# Patient Record
Sex: Female | Born: 1990 | Race: White | Hispanic: No | Smoking: Never smoker
Health system: Southern US, Community
[De-identification: ages and names within clinical notes are randomized; demographics above are authoritative.]

## PROBLEM LIST (undated history)

## (undated) ENCOUNTER — Inpatient Hospital Stay (HOSPITAL_COMMUNITY): Payer: Self-pay

## (undated) DIAGNOSIS — N200 Calculus of kidney: Secondary | ICD-10-CM

## (undated) DIAGNOSIS — J189 Pneumonia, unspecified organism: Secondary | ICD-10-CM

## (undated) DIAGNOSIS — J111 Influenza due to unidentified influenza virus with other respiratory manifestations: Secondary | ICD-10-CM

## (undated) DIAGNOSIS — N189 Chronic kidney disease, unspecified: Secondary | ICD-10-CM

## (undated) HISTORY — PX: TONSILLECTOMY: SUR1361

## (undated) HISTORY — PX: LAPAROSCOPIC ABDOMINAL EXPLORATION: SHX6249

## (undated) HISTORY — DX: Chronic kidney disease, unspecified: N18.9

---
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
## 2009-01-29 ENCOUNTER — Observation Stay: Payer: Self-pay

## 2009-01-29 ENCOUNTER — Inpatient Hospital Stay: Payer: Self-pay

## 2009-07-02 ENCOUNTER — Ambulatory Visit: Payer: Self-pay | Admitting: Internal Medicine

## 2009-09-29 ENCOUNTER — Emergency Department: Payer: Self-pay | Admitting: Emergency Medicine

## 2010-06-02 ENCOUNTER — Emergency Department: Payer: Self-pay | Admitting: Internal Medicine

## 2010-09-04 ENCOUNTER — Emergency Department: Payer: Self-pay | Admitting: Emergency Medicine

## 2011-03-13 ENCOUNTER — Emergency Department: Payer: Self-pay | Admitting: Emergency Medicine

## 2011-03-18 ENCOUNTER — Emergency Department: Payer: Self-pay | Admitting: Emergency Medicine

## 2011-07-16 ENCOUNTER — Emergency Department: Payer: Self-pay | Admitting: Emergency Medicine

## 2011-07-16 LAB — URINALYSIS, COMPLETE
Bilirubin,UR: NEGATIVE
Blood: NEGATIVE
Glucose,UR: NEGATIVE mg/dL (ref 0–75)
Ketone: NEGATIVE
Leukocyte Esterase: NEGATIVE
Ph: 8 (ref 4.5–8.0)
RBC,UR: 2 /HPF (ref 0–5)
Squamous Epithelial: 14
WBC UR: 5 /HPF (ref 0–5)

## 2011-07-16 LAB — PREGNANCY, URINE: Pregnancy Test, Urine: NEGATIVE m[IU]/mL

## 2012-02-15 ENCOUNTER — Emergency Department: Payer: Self-pay | Admitting: Emergency Medicine

## 2012-02-15 LAB — URINALYSIS, COMPLETE
Bacteria: NONE SEEN
Blood: NEGATIVE
Glucose,UR: NEGATIVE mg/dL (ref 0–75)
Ketone: NEGATIVE
Leukocyte Esterase: NEGATIVE
Nitrite: NEGATIVE
Ph: 6 (ref 4.5–8.0)
Protein: 25
Specific Gravity: 1.02 (ref 1.003–1.030)
Squamous Epithelial: 4
WBC UR: 3 /HPF (ref 0–5)

## 2012-09-09 ENCOUNTER — Emergency Department: Payer: Self-pay | Admitting: Emergency Medicine

## 2013-01-06 ENCOUNTER — Encounter (HOSPITAL_COMMUNITY): Payer: Self-pay | Admitting: Radiology

## 2013-01-06 ENCOUNTER — Emergency Department (HOSPITAL_COMMUNITY): Payer: No Typology Code available for payment source

## 2013-01-06 ENCOUNTER — Emergency Department (HOSPITAL_COMMUNITY)
Admission: EM | Admit: 2013-01-06 | Discharge: 2013-01-06 | Disposition: A | Discharge status: Home / Self Care | Payer: No Typology Code available for payment source | Attending: Emergency Medicine | Admitting: Emergency Medicine

## 2013-01-06 DIAGNOSIS — S139XXA Sprain of joints and ligaments of unspecified parts of neck, initial encounter: Secondary | ICD-10-CM | POA: Insufficient documentation

## 2013-01-06 DIAGNOSIS — S161XXA Strain of muscle, fascia and tendon at neck level, initial encounter: Secondary | ICD-10-CM

## 2013-01-06 DIAGNOSIS — S233XXA Sprain of ligaments of thoracic spine, initial encounter: Secondary | ICD-10-CM

## 2013-01-06 DIAGNOSIS — Y92410 Unspecified street and highway as the place of occurrence of the external cause: Secondary | ICD-10-CM | POA: Insufficient documentation

## 2013-01-06 DIAGNOSIS — S239XXA Sprain of unspecified parts of thorax, initial encounter: Secondary | ICD-10-CM | POA: Insufficient documentation

## 2013-01-06 DIAGNOSIS — S0083XA Contusion of other part of head, initial encounter: Secondary | ICD-10-CM

## 2013-01-06 DIAGNOSIS — Z3202 Encounter for pregnancy test, result negative: Secondary | ICD-10-CM | POA: Insufficient documentation

## 2013-01-06 DIAGNOSIS — S39012A Strain of muscle, fascia and tendon of lower back, initial encounter: Secondary | ICD-10-CM

## 2013-01-06 DIAGNOSIS — Y9389 Activity, other specified: Secondary | ICD-10-CM | POA: Insufficient documentation

## 2013-01-06 DIAGNOSIS — S335XXA Sprain of ligaments of lumbar spine, initial encounter: Secondary | ICD-10-CM | POA: Insufficient documentation

## 2013-01-06 DIAGNOSIS — S0003XA Contusion of scalp, initial encounter: Secondary | ICD-10-CM | POA: Insufficient documentation

## 2013-01-06 LAB — URINE MICROSCOPIC-ADD ON

## 2013-01-06 LAB — CBC WITH DIFFERENTIAL/PLATELET
Basophils Absolute: 0.1 10*3/uL (ref 0.0–0.1)
Basophils Relative: 1 % (ref 0–1)
Eosinophils Absolute: 0.2 10*3/uL (ref 0.0–0.7)
Eosinophils Relative: 3 % (ref 0–5)
HCT: 43.1 % (ref 36.0–46.0)
MCHC: 35 g/dL (ref 30.0–36.0)
MCV: 92.9 fL (ref 78.0–100.0)
Monocytes Absolute: 0.4 10*3/uL (ref 0.1–1.0)
Platelets: 200 10*3/uL (ref 150–400)
RDW: 12 % (ref 11.5–15.5)
WBC: 5.9 10*3/uL (ref 4.0–10.5)

## 2013-01-06 LAB — URINALYSIS, ROUTINE W REFLEX MICROSCOPIC
Glucose, UA: NEGATIVE mg/dL
Leukocytes, UA: NEGATIVE
Protein, ur: NEGATIVE mg/dL
Specific Gravity, Urine: 1.02 (ref 1.005–1.030)

## 2013-01-06 LAB — CG4 I-STAT (LACTIC ACID): Lactic Acid, Venous: 1.61 mmol/L (ref 0.5–2.2)

## 2013-01-06 LAB — COMPREHENSIVE METABOLIC PANEL
ALT: 15 U/L (ref 0–35)
AST: 19 U/L (ref 0–37)
Albumin: 3.7 g/dL (ref 3.5–5.2)
CO2: 19 mEq/L (ref 19–32)
Calcium: 8.7 mg/dL (ref 8.4–10.5)
Creatinine, Ser: 0.71 mg/dL (ref 0.50–1.10)
GFR calc non Af Amer: >90 mL/min (ref >90)
Sodium: 137 mEq/L (ref 135–145)
Total Protein: 7.1 g/dL (ref 6.0–8.3)

## 2013-01-06 MED ORDER — SODIUM CHLORIDE 0.9 % IV BOLUS (SEPSIS): 1000.0000 mL | Freq: Once | INTRAVENOUS | Status: DC

## 2013-01-06 MED ORDER — MORPHINE SULFATE 4 MG/ML IJ SOLN
4.0000 mg | Freq: Once | INTRAMUSCULAR | Status: AC
  Administered 2013-01-06: 4 mg via INTRAVENOUS
  Filled 2013-01-06: qty 1

## 2013-01-06 MED ORDER — ONDANSETRON HCL 4 MG/2ML IJ SOLN
4.0000 mg | Freq: Once | INTRAMUSCULAR | Status: AC
  Administered 2013-01-06: 4 mg via INTRAVENOUS
  Filled 2013-01-06: qty 2

## 2013-01-06 MED ORDER — IOHEXOL 300 MG/ML  SOLN
100.0000 mL | Freq: Once | INTRAMUSCULAR | Status: AC | PRN
  Administered 2013-01-06: 100 mL via INTRAVENOUS

## 2013-01-06 MED ORDER — SODIUM CHLORIDE 0.9 % IV SOLN
1000.0000 mL | INTRAVENOUS | Status: DC
  Administered 2013-01-06: 1000 mL via INTRAVENOUS

## 2013-01-06 MED ORDER — OXYCODONE-ACETAMINOPHEN 5-325 MG PO TABS: 1.0000 | ORAL_TABLET | ORAL | Status: DC | PRN

## 2013-01-06 MED ORDER — OXYCODONE-ACETAMINOPHEN 5-325 MG PO TABS
1.0000 | ORAL_TABLET | Freq: Once | ORAL | Status: AC
  Administered 2013-01-06: 1 via ORAL
  Filled 2013-01-06: qty 1

## 2013-01-06 MED ORDER — SODIUM CHLORIDE 0.9 % IV SOLN
1000.0000 mL | Freq: Once | INTRAVENOUS | Status: AC
  Administered 2013-01-06: 1000 mL via INTRAVENOUS
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
## 2013-01-06 NOTE — ED Notes (Signed)
MD at bedside. 
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
## 2013-01-06 NOTE — ED Notes (Signed)
Dr. Glick at bedside.  

## 2013-01-09 ENCOUNTER — Emergency Department (HOSPITAL_COMMUNITY): Payer: No Typology Code available for payment source

## 2013-01-09 ENCOUNTER — Emergency Department (HOSPITAL_COMMUNITY)
Admission: EM | Admit: 2013-01-09 | Discharge: 2013-01-09 | Disposition: A | Discharge status: Home / Self Care | Payer: Medicaid Other | Attending: Emergency Medicine | Admitting: Emergency Medicine

## 2013-01-09 ENCOUNTER — Encounter (HOSPITAL_COMMUNITY): Payer: Self-pay | Admitting: Emergency Medicine

## 2013-01-09 DIAGNOSIS — R112 Nausea with vomiting, unspecified: Secondary | ICD-10-CM | POA: Insufficient documentation

## 2013-01-09 DIAGNOSIS — R51 Headache: Secondary | ICD-10-CM | POA: Insufficient documentation

## 2013-01-09 DIAGNOSIS — F0781 Postconcussional syndrome: Secondary | ICD-10-CM

## 2013-01-09 DIAGNOSIS — R42 Dizziness and giddiness: Secondary | ICD-10-CM | POA: Insufficient documentation

## 2013-01-09 DIAGNOSIS — Z79899 Other long term (current) drug therapy: Secondary | ICD-10-CM | POA: Insufficient documentation

## 2013-01-09 DIAGNOSIS — M542 Cervicalgia: Secondary | ICD-10-CM | POA: Insufficient documentation

## 2013-01-09 MED ORDER — ONDANSETRON HCL 4 MG/2ML IJ SOLN
4.0000 mg | Freq: Once | INTRAMUSCULAR | Status: AC
  Administered 2013-01-09: 4 mg via INTRAVENOUS
  Filled 2013-01-09: qty 2

## 2013-01-09 MED ORDER — SODIUM CHLORIDE 0.9 % IV SOLN
INTRAVENOUS | Status: DC
  Administered 2013-01-09: 19:00:00 via INTRAVENOUS

## 2013-01-09 MED ORDER — OXYCODONE-ACETAMINOPHEN 5-325 MG PO TABS: 1.0000 | ORAL_TABLET | ORAL | Status: DC | PRN

## 2013-01-09 MED ORDER — MORPHINE SULFATE 4 MG/ML IJ SOLN
4.0000 mg | Freq: Once | INTRAMUSCULAR | Status: AC
  Administered 2013-01-09: 4 mg via INTRAVENOUS
  Filled 2013-01-09: qty 1

## 2013-01-09 MED ORDER — SODIUM CHLORIDE 0.9 % IV BOLUS (SEPSIS)
1000.0000 mL | Freq: Once | INTRAVENOUS | Status: AC
  Administered 2013-01-09: 1000 mL via INTRAVENOUS

## 2013-01-09 MED ORDER — METHOCARBAMOL 500 MG PO TABS: 500.0000 mg | ORAL_TABLET | Freq: Two times a day (BID) | ORAL | Status: DC
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
## 2013-01-09 NOTE — ED Notes (Signed)
Pt undressed waiting in room
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
## 2013-01-09 NOTE — ED Provider Notes (Signed)
Medical screening examination/treatment/procedure(s) were conducted as a shared visit with non-physician practitioner(s) and myself.  I personally evaluated the patient during the encounter  Please see my separate respective documentation pertaining to this patient encounter   Vida Roller, MD 01/09/13 1945
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
## 2013-01-09 NOTE — ED Notes (Signed)
PA at bedside.

## 2013-04-27 ENCOUNTER — Emergency Department (HOSPITAL_BASED_OUTPATIENT_CLINIC_OR_DEPARTMENT_OTHER)
Admission: EM | Admit: 2013-04-27 | Discharge: 2013-04-27 | Disposition: A | Discharge status: Home / Self Care | Payer: Medicaid Other | Attending: Emergency Medicine | Admitting: Emergency Medicine

## 2013-04-27 ENCOUNTER — Encounter (HOSPITAL_BASED_OUTPATIENT_CLINIC_OR_DEPARTMENT_OTHER): Payer: Self-pay | Admitting: Emergency Medicine

## 2013-04-27 ENCOUNTER — Emergency Department (HOSPITAL_BASED_OUTPATIENT_CLINIC_OR_DEPARTMENT_OTHER): Payer: Medicaid Other

## 2013-04-27 DIAGNOSIS — O4690 Antepartum hemorrhage, unspecified, unspecified trimester: Secondary | ICD-10-CM | POA: Insufficient documentation

## 2013-04-27 DIAGNOSIS — N949 Unspecified condition associated with female genital organs and menstrual cycle: Secondary | ICD-10-CM | POA: Insufficient documentation

## 2013-04-27 DIAGNOSIS — Z79899 Other long term (current) drug therapy: Secondary | ICD-10-CM | POA: Insufficient documentation

## 2013-04-27 DIAGNOSIS — R102 Pelvic and perineal pain: Secondary | ICD-10-CM

## 2013-04-27 DIAGNOSIS — M545 Low back pain, unspecified: Secondary | ICD-10-CM | POA: Insufficient documentation

## 2013-04-27 DIAGNOSIS — Z3490 Encounter for supervision of normal pregnancy, unspecified, unspecified trimester: Secondary | ICD-10-CM

## 2013-04-27 LAB — URINALYSIS, ROUTINE W REFLEX MICROSCOPIC
Bilirubin Urine: NEGATIVE
Hgb urine dipstick: NEGATIVE
Nitrite: NEGATIVE
Protein, ur: NEGATIVE mg/dL
Urobilinogen, UA: 0.2 mg/dL (ref 0.0–1.0)

## 2013-04-27 MED ORDER — HYDROCODONE-ACETAMINOPHEN 5-325 MG PO TABS: 2.0000 | ORAL_TABLET | ORAL | Status: DC | PRN

## 2013-04-27 MED ORDER — HYDROCODONE-ACETAMINOPHEN 5-325 MG PO TABS
2.0000 | ORAL_TABLET | Freq: Once | ORAL | Status: AC
  Administered 2013-04-27: 2 via ORAL
  Filled 2013-04-27: qty 2

## 2013-04-27 NOTE — ED Provider Notes (Signed)
CSN: [id number]     Arrival date & time 04/27/13  1759 History  This chart was scribed for Geoffery Lyons, MD by Leone Payor, ED Scribe. This patient was seen in room MH03/MH03 and the patient's care was started 6:17 PM.     Chief Complaint  Patient presents with  . Vaginal Bleeding    19 wk preg    The history is provided by the patient. No language interpreter was used.    HPI Comments: Katherine Conrad is a [redacted] week pregnant 22 y.o. female who presents to the Emergency Department complaining of constant, gradually worsening, lower abdominal and lower back pain that began 3 days ago. Pt describes this pain as cramping and states it is severe. Pt had an [place] at [facility] on 03/07/13 and was [redacted] weeks pregnant at that time. Pt reports having minor spotting before during this pregnancy. She has reports having 1 other full term pregnancy. She denies fever, dysuria, diarrhea, constipation.   History reviewed. No pertinent past medical history. Past Surgical History  Procedure Laterality Date  . Tonsillectomy    . Laparoscopic abdominal exploration     No family history on file. History  Substance Use Topics  . Smoking status: Never Smoker   . Smokeless tobacco: Not on file  . Alcohol Use: Yes   OB History   Grav Para Term Preterm Abortions TAB SAB Ect Mult Living   3 1 1             Review of Systems A complete 10 system review of systems was obtained and all systems are negative except as noted in the HPI and PMH.   Allergies  Review of patient's allergies indicates no known allergies.  Home Medications   Current Outpatient Rx  Name  Route  Sig  Dispense  Refill  . Prenatal Vit-Fe Fumarate-FA (PRENATAL MULTIVITAMIN) TABS tablet   Oral   Take 1 tablet by mouth daily at 12 noon.          BP 132/75  Pulse 100  Temp(Src) 97.9 F (36.6 C) (Oral)  Resp 18  Ht 5\' 7"  (1.702 m)  Wt 140 lb (63.504 kg)  BMI 21.92 kg/m2  SpO2 100%  LMP 01/03/2013 Physical Exam  Nursing  note and vitals reviewed. Constitutional: She is oriented to person, place, and time. She appears well-developed and well-nourished.  HENT:  Head: Normocephalic and atraumatic.  Cardiovascular: Normal rate, regular rhythm and normal heart sounds.   Pulmonary/Chest: Effort normal and breath sounds normal. No respiratory distress. She has no wheezes.  Abdominal: Soft. She exhibits no distension. There is tenderness.  Mild suprapubic tenderness to palpation. Fundal height is consistent with stated gestational age.   Neurological: She is alert and oriented to person, place, and time.  Skin: Skin is warm and dry.  Psychiatric: She has a normal mood and affect.    ED Course  Procedures   DIAGNOSTIC STUDIES: Oxygen Saturation is 100% on RA, normal by my interpretation.    COORDINATION OF CARE: 6:21 PM Discussed treatment plan with pt at bedside and pt agreed to plan.   Labs Review Labs Reviewed - No data to display Imaging Review No results found.    MDM  No diagnosis found. Patient is a 22 year old female G2 P1 001 at [redacted] weeks gestation. She presents here with lower abdominal discomfort for the past 3 days in the absence of any injury or trauma. She states that when she wiped after urinating she noticed a little  pinkish smear. She denies any fevers or chills. She denies any dysuria.  On exam she is tender in the suprapubic region with no rebound or guarding. Urinalysis reveals trace leukocytes and few bacteria but is otherwise unremarkable. An ultrasound was obtained which revealed a possible low-lying placenta however no acute findings. Fetal heart tones are present in the exam is otherwise reassuring. Patient tells me that she had similar discomfort with her first pregnancy.  She will be discharged to home with pain medication and advised to followup with OB in the next one to 2 weeks, sooner if not improving. She also understands to present to [facility] hospital if her symptoms worsen  or change.  I personally performed the services described in this documentation, which was scribed in my presence. The recorded information has been reviewed and is accurate.      Geoffery Lyons, MD 04/27/13 [phone]

## 2013-04-27 NOTE — ED Notes (Signed)
Pt reports she is 19 weeks preg- c/o abd pain x 3 days- states she noticed vaginal bleeding when she wiped after using the bathroom approx pta

## 2013-05-15 ENCOUNTER — Emergency Department (HOSPITAL_BASED_OUTPATIENT_CLINIC_OR_DEPARTMENT_OTHER)
Admission: EM | Admit: 2013-05-15 | Discharge: 2013-05-15 | Disposition: A | Discharge status: Home / Self Care | Payer: Medicaid Other | Attending: Emergency Medicine | Admitting: Emergency Medicine

## 2013-05-15 ENCOUNTER — Emergency Department (HOSPITAL_BASED_OUTPATIENT_CLINIC_OR_DEPARTMENT_OTHER): Payer: Medicaid Other

## 2013-05-15 ENCOUNTER — Encounter (HOSPITAL_BASED_OUTPATIENT_CLINIC_OR_DEPARTMENT_OTHER): Payer: Self-pay | Admitting: Emergency Medicine

## 2013-05-15 DIAGNOSIS — Z8701 Personal history of pneumonia (recurrent): Secondary | ICD-10-CM | POA: Insufficient documentation

## 2013-05-15 DIAGNOSIS — O9989 Other specified diseases and conditions complicating pregnancy, childbirth and the puerperium: Secondary | ICD-10-CM | POA: Insufficient documentation

## 2013-05-15 DIAGNOSIS — J111 Influenza due to unidentified influenza virus with other respiratory manifestations: Secondary | ICD-10-CM | POA: Insufficient documentation

## 2013-05-15 DIAGNOSIS — R69 Illness, unspecified: Secondary | ICD-10-CM

## 2013-05-15 HISTORY — DX: Pneumonia, unspecified organism: J18.9

## 2013-05-15 LAB — CBC WITH DIFFERENTIAL/PLATELET
BASOS ABS: 0 10*3/uL (ref 0.0–0.1)
BASOS PCT: 0 % (ref 0–1)
Eosinophils Absolute: 0.2 10*3/uL (ref 0.0–0.7)
Eosinophils Relative: 2 % (ref 0–5)
HCT: 35.1 % — ABNORMAL LOW (ref 36.0–46.0)
HEMOGLOBIN: 12 g/dL (ref 12.0–15.0)
Lymphocytes Relative: 29 % (ref 12–46)
Lymphs Abs: 2.9 10*3/uL (ref 0.7–4.0)
MCH: 31.5 pg (ref 26.0–34.0)
MCHC: 34.2 g/dL (ref 30.0–36.0)
MCV: 92.1 fL (ref 78.0–100.0)
Monocytes Absolute: 0.8 10*3/uL (ref 0.1–1.0)
Monocytes Relative: 8 % (ref 3–12)
NEUTROS ABS: 6.1 10*3/uL (ref 1.7–7.7)
NEUTROS PCT: 61 % (ref 43–77)
PLATELETS: 169 10*3/uL (ref 150–400)
RBC: 3.81 MIL/uL — ABNORMAL LOW (ref 3.87–5.11)
RDW: 11.7 % (ref 11.5–15.5)
WBC: 10 10*3/uL (ref 4.0–10.5)

## 2013-05-15 LAB — BASIC METABOLIC PANEL
BUN: 10 mg/dL (ref 6–23)
CHLORIDE: 102 meq/L (ref 96–112)
CO2: 22 mEq/L (ref 19–32)
Calcium: 9.2 mg/dL (ref 8.4–10.5)
Creatinine, Ser: 0.6 mg/dL (ref 0.50–1.10)
Glucose, Bld: 96 mg/dL (ref 70–99)
POTASSIUM: 3.6 meq/L — AB (ref 3.7–5.3)
SODIUM: 139 meq/L (ref 137–147)

## 2013-05-15 MED ORDER — HYDROCODONE-ACETAMINOPHEN 5-325 MG PO TABS: 2.0000 | ORAL_TABLET | ORAL | Status: DC | PRN

## 2013-05-15 MED ORDER — OSELTAMIVIR PHOSPHATE 75 MG PO CAPS: 75.0000 mg | ORAL_CAPSULE | Freq: Two times a day (BID) | ORAL | Status: DC

## 2013-05-15 MED ORDER — ONDANSETRON HCL 4 MG/2ML IJ SOLN
4.0000 mg | Freq: Once | INTRAMUSCULAR | Status: AC
  Administered 2013-05-15: 4 mg via INTRAVENOUS
  Filled 2013-05-15: qty 2

## 2013-05-15 MED ORDER — MORPHINE SULFATE 4 MG/ML IJ SOLN
4.0000 mg | Freq: Once | INTRAMUSCULAR | Status: AC
  Administered 2013-05-15: 4 mg via INTRAVENOUS
  Filled 2013-05-15: qty 1

## 2013-05-15 MED ORDER — ONDANSETRON 4 MG PO TBDP: 4.0000 mg | ORAL_TABLET | Freq: Three times a day (TID) | ORAL | Status: DC | PRN

## 2013-05-15 MED ORDER — SODIUM CHLORIDE 0.9 % IV BOLUS (SEPSIS)
1000.0000 mL | Freq: Once | INTRAVENOUS | Status: AC
  Administered 2013-05-15: 1000 mL via INTRAVENOUS

## 2013-05-15 NOTE — ED Provider Notes (Signed)
CSN: [id number]     Arrival date & time 05/15/13  1738 History   First MD Initiated Contact with Patient 05/15/13 1906     Chief Complaint  Patient presents with  . Fever   (Consider location/radiation/quality/duration/timing/severity/associated sxs/prior Treatment) HPI Comments: Patient is a 23 year old 353P1 female who is about 4 months pregnant who presents with multiple complaints since last night. Symptoms started gradually and progressively worsened since the onset. Patient reports generalized body aches, abdominal pain, nausea, vomiting, diarrhea, fever and cough. She has not tried anything for symptoms at home. She reports being around her brother who was recently diagnosed with the flu. No aggravating/alleviating factors.    Past Medical History  Diagnosis Date  . Pneumonia    Past Surgical History  Procedure Laterality Date  . Tonsillectomy    . Laparoscopic abdominal exploration     History reviewed. No pertinent family history. History  Substance Use Topics  . Smoking status: Never Smoker   . Smokeless tobacco: Not on file  . Alcohol Use: Yes   OB History   Grav Para Term Preterm Abortions TAB SAB Ect Mult Living   3 1 1             Review of Systems  Constitutional: Negative for fever, chills and fatigue.  HENT: Negative for trouble swallowing.   Eyes: Negative for visual disturbance.  Respiratory: Positive for cough. Negative for shortness of breath.   Cardiovascular: Negative for chest pain and palpitations.  Gastrointestinal: Positive for nausea, vomiting, abdominal pain and diarrhea.  Genitourinary: Negative for dysuria and difficulty urinating.  Musculoskeletal: Negative for arthralgias and neck pain.  Skin: Negative for color change.  Neurological: Negative for dizziness and weakness.  Psychiatric/Behavioral: Negative for dysphoric mood.    Allergies  Review of patient's allergies indicates no known allergies.  Home Medications   Current Outpatient  Rx  Name  Route  Sig  Dispense  Refill  . HYDROcodone-acetaminophen (NORCO) 5-325 MG per tablet   Oral   Take 2 tablets by mouth every 4 (four) hours as needed.   12 tablet   0   . Prenatal Vit-Fe Fumarate-FA (PRENATAL MULTIVITAMIN) TABS tablet   Oral   Take 1 tablet by mouth daily at 12 noon.          BP 110/71  Pulse 120  Temp(Src) 98.3 F (36.8 C) (Oral)  Resp 18  Ht 5\' 7"  (1.702 m)  Wt 140 lb (63.504 kg)  BMI 21.92 kg/m2  SpO2 99%  LMP 01/03/2013 Physical Exam  Nursing note and vitals reviewed. Constitutional: She is oriented to person, place, and time. She appears well-developed and well-nourished. No distress.  HENT:  Head: Normocephalic and atraumatic.  Mouth/Throat: Oropharynx is clear and moist. No oropharyngeal exudate.  Eyes: Conjunctivae and EOM are normal.  Neck: Normal range of motion.  Cardiovascular: Normal rate and regular rhythm.  Exam reveals no gallop and no friction rub.   No murmur heard. Pulmonary/Chest: Effort normal and breath sounds normal. She has no wheezes. She has no rales. She exhibits no tenderness.  Abdominal: Soft. She exhibits no distension. There is no tenderness. There is no rebound and no guarding.  Mild generalized tenderness to palpation. No focal tenderness to palpation or peritoneal signs.   Musculoskeletal: Normal range of motion.  Neurological: She is alert and oriented to person, place, and time.  Speech is goal-oriented. Moves limbs without ataxia.   Skin: Skin is warm and dry.  Psychiatric: She has a normal  mood and affect. Her behavior is normal.    ED Course  Procedures (including critical care time) Labs Review Labs Reviewed  CBC WITH DIFFERENTIAL - Abnormal; Notable for the following:    RBC 3.81 (*)    HCT 35.1 (*)    All other components within normal limits  BASIC METABOLIC PANEL - Abnormal; Notable for the following:    Potassium 3.6 (*)    All other components within normal limits   Imaging Review No  results found.  EKG Interpretation   None       MDM   1. Influenza-like illness     8:03 PM Labs pending. Patient will have IV fluids, zofran and morphine. Patient likely has a viral illness or influenza. Patient is tachycardic with stable vitals.   11:04 PM Labs unremarkable for acute changes. Vitals stable and patient afebrile. Patient feeling better. Patient will have tamiflu, zofran and vicodin for symptoms. Patient advised to return with worsening or concerning symptoms.   Emilia Beck, PA-C 05/15/13 2304

## 2013-05-15 NOTE — ED Notes (Addendum)
Patient with body aches, fever, cough, sore throat, lower back pain, diarrhea, weakness.  Patient thinks she has the flu.  Mask on patient.  Patient is about 4 months pregnant

## 2013-05-15 NOTE — ED Provider Notes (Signed)
  Medical screening examination/treatment/procedure(s) were performed by non-physician practitioner and as supervising physician I was immediately available for consultation/collaboration.  EKG Interpretation   None          Gerhard Munchobert Asuncion Tapscott, MD 05/15/13 2328

## 2013-05-15 NOTE — Discharge Instructions (Signed)
Take Tamiflu as directed until gone. Take Zofran as needed for nausea. Take Vicodin as needed for pain. Refer to attached documents for more information. Return to the ED with worsening or concerning symptoms.

## 2013-05-15 NOTE — ED Notes (Signed)
C/o fever 101.7, cough, back pain and body aches. Pt is pregnant and due in June per her report

## 2013-05-24 ENCOUNTER — Encounter: Payer: Self-pay | Admitting: Family

## 2013-05-24 ENCOUNTER — Ambulatory Visit (INDEPENDENT_AMBULATORY_CARE_PROVIDER_SITE_OTHER): Payer: Medicaid Other | Admitting: Family

## 2013-05-24 VITALS — BP 106/62 | Temp 98.7°F | Wt 147.8 lb

## 2013-05-24 DIAGNOSIS — O0990 Supervision of high risk pregnancy, unspecified, unspecified trimester: Secondary | ICD-10-CM

## 2013-05-24 DIAGNOSIS — O0930 Supervision of pregnancy with insufficient antenatal care, unspecified trimester: Secondary | ICD-10-CM

## 2013-05-24 DIAGNOSIS — Z23 Encounter for immunization: Secondary | ICD-10-CM

## 2013-05-24 DIAGNOSIS — Z1379 Encounter for other screening for genetic and chromosomal anomalies: Secondary | ICD-10-CM

## 2013-05-24 LAB — POCT URINALYSIS DIP (DEVICE)
Bilirubin Urine: NEGATIVE
GLUCOSE, UA: NEGATIVE mg/dL
Hgb urine dipstick: NEGATIVE
KETONES UR: NEGATIVE mg/dL
LEUKOCYTES UA: NEGATIVE
Nitrite: NEGATIVE
Protein, ur: NEGATIVE mg/dL
SPECIFIC GRAVITY, URINE: 1.025 (ref 1.005–1.030)
Urobilinogen, UA: 0.2 mg/dL (ref 0.0–1.0)
pH: 7 (ref 5.0–8.0)

## 2013-05-24 NOTE — Progress Notes (Signed)
   Subjective:    Katherine Conrad is a G2P1001 4986w3d being seen today for her first obstetrical visit.  Her obstetrical history is significant for possible low lying placenta.  Bleeding during early pregnancy.  Patient does intend to breast feed. Pregnancy history fully reviewed.  Patient reports backache and cramping.  Backache and cramping is intermittent lasting approximately 5 mins, occuring 3-5x/day.    Filed Vitals:   05/24/13 1430  BP: 106/62  Temp: 98.7 F (37.1 C)  Weight: 147 lb 12.8 oz (67.042 kg)    HISTORY: OB History  Gravida Para Term Preterm AB SAB TAB Ectopic Multiple Living  2 1 1       1     # Outcome Date GA Lbr Len/2nd Weight Sex Delivery Anes PTL Lv  1 TRM              Past Medical History  Diagnosis Date  . Pneumonia   . Chronic kidney disease     Kidney stones   Past Surgical History  Procedure Laterality Date  . Tonsillectomy    . Laparoscopic abdominal exploration     History reviewed. No pertinent family history.   Exam    Filed Vitals:   05/24/13 1430  BP: 106/62  Temp: 98.7 F (37.1 C)   Exam   BP 106/62  Temp(Src) 98.7 F (37.1 C)  Wt 147 lb 12.8 oz (67.042 kg)  LMP 01/03/2013  Breastfeeding? Unknown Uterine Size: size equals dates  Pelvic Exam: Deferred due to ? Low lying placenta   Perineum: No Hemorrhoids, Normal Perineum   Vulva: normal   Vagina:  normal mucosa, normal discharge, no palpable nodules; no bleeding seen  System: Breast:  No nipple retraction or dimpling, No nipple discharge or bleeding, No axillary or supraclavicular adenopathy, Normal to palpation without dominant masses   Skin: normal coloration and turgor, no rashes    Neurologic: negative   Extremities: normal strength, tone, and muscle mass   HEENT neck supple with midline trachea and thyroid without masses   Mouth/Teeth mucous membranes moist, pharynx normal without lesions   Neck supple and no masses   Cardiovascular: regular rate and rhythm, no  murmurs or gallops   Respiratory:  appears well, vitals normal, no respiratory distress, acyanotic, normal RR, neck free of mass or lymphadenopathy, chest clear, no wheezing, crepitations, rhonchi, normal symmetric air entry   Abdomen: soft, non-tender; bowel sounds normal; no masses,  no organomegaly   Urinary: urethral meatus normal      Assessment:    Pregnancy: G2P1001 at 6886w3d wk IUP Low Lying Placenta - ?       Plan:     Initial labs drawn with [facility] screen. Keep scheduled appointment for ultrasound on 06/02/12.   Pelvic exam deferred until ultrasound to assess placenta.   Flu vaccine today Prenatal vitamins. Problem list reviewed and updated.  Follow up in 3 weeks.  [facility] Center[facility]UHAMMAD,WALIDAH 05/24/2013

## 2013-05-24 NOTE — Addendum Note (Signed)
Addended by: Franchot MimesALFARO, Giorgia Wahler on: 05/24/2013 04:49 PM   Modules accepted: Orders

## 2013-05-24 NOTE — Progress Notes (Signed)
Pulse- 85  Pain-lower abd and sometimes radiates to lower back Weight gain 25-35lbs Flu vaccine consented and info given New ob packet given

## 2013-05-25 LAB — OBSTETRIC PANEL
Antibody Screen: NEGATIVE
BASOS ABS: 0 10*3/uL (ref 0.0–0.1)
Basophils Relative: 0 % (ref 0–1)
EOS PCT: 1 % (ref 0–5)
Eosinophils Absolute: 0.1 10*3/uL (ref 0.0–0.7)
HEMATOCRIT: 35.1 % — AB (ref 36.0–46.0)
Hemoglobin: 12.2 g/dL (ref 12.0–15.0)
Hepatitis B Surface Ag: NEGATIVE
LYMPHS ABS: 1.9 10*3/uL (ref 0.7–4.0)
LYMPHS PCT: 20 % (ref 12–46)
MCH: 31.5 pg (ref 26.0–34.0)
MCHC: 34.8 g/dL (ref 30.0–36.0)
MCV: 90.7 fL (ref 78.0–100.0)
Monocytes Absolute: 0.6 10*3/uL (ref 0.1–1.0)
Monocytes Relative: 7 % (ref 3–12)
NEUTROS ABS: 7.1 10*3/uL (ref 1.7–7.7)
Neutrophils Relative %: 72 % (ref 43–77)
PLATELETS: 185 10*3/uL (ref 150–400)
RBC: 3.87 MIL/uL (ref 3.87–5.11)
RDW: 12.7 % (ref 11.5–15.5)
RUBELLA: 1.45 {index} — AB (ref <0.90)
Rh Type: POSITIVE
WBC: 9.7 10*3/uL (ref 4.0–10.5)

## 2013-05-25 LAB — AFP, QUAD SCREEN
AFP: 38.3 [IU]/mL
Curr Gest Age: 18.3 wks.days
HCG TOTAL: 23363 m[IU]/mL
INH: 224.8 pg/mL
INTERPRETATION-AFP: NEGATIVE
MOM FOR HCG: 1.3
MOM FOR INH: 1.18
MoM for AFP: 0.89
OPEN SPINA BIFIDA: NEGATIVE
TRI 18 SCR RISK EST: NEGATIVE
Trisomy 18 (Edward) Syndrome Interp.: 1:32900 {titer}
UE3 MOM: 0.69
uE3 Value: 0.6 ng/mL

## 2013-05-25 LAB — HIV ANTIBODY (ROUTINE TESTING W REFLEX): HIV: NONREACTIVE

## 2013-05-26 ENCOUNTER — Encounter: Payer: Self-pay | Admitting: Family

## 2013-05-26 LAB — BENZODIAZEPINES (GC/LC/MS), URINE
Alprazolam metabolite (GC/LC/MS), ur confirm: 572 ng/mL — AB
Clonazepam metabolite (GC/LC/MS), ur confirm: NEGATIVE ng/mL
Diazepam (GC/LC/MS), ur confirm: NEGATIVE ng/mL
ESTAZOLAMU: NEGATIVE ng/mL
FLURAZEPAMU: NEGATIVE ng/mL
Flunitrazepam metabolite (GC/LC/MS), ur confirm: NEGATIVE ng/mL
HYDROXYALPRAZ UR: 399 ng/mL — AB
Lorazepam (GC/LC/MS), ur confirm: NEGATIVE ng/mL
MIDAZOLAMU: NEGATIVE ng/mL
NORDIAZEPAMU: NEGATIVE ng/mL
Oxazepam (GC/LC/MS), ur confirm: NEGATIVE ng/mL
Temazepam (GC/LC/MS), ur confirm: NEGATIVE ng/mL
Triazolam metabolite (GC/LC/MS), ur confirm: NEGATIVE ng/mL

## 2013-05-26 LAB — CULTURE, OB URINE
COLONY COUNT: NO GROWTH
Organism ID, Bacteria: NO GROWTH

## 2013-05-26 LAB — PRESCRIPTION MONITORING PROFILE (19 PANEL)
AMPHETAMINE/METH: NEGATIVE ng/mL
BUPRENORPHINE, URINE: NEGATIVE ng/mL
Barbiturate Screen, Urine: NEGATIVE ng/mL
CANNABINOID SCRN UR: NEGATIVE ng/mL
CARISOPRODOL, URINE: NEGATIVE ng/mL
COCAINE METABOLITES: NEGATIVE ng/mL
CREATININE, URINE: 169.87 mg/dL (ref >20.0)
ECSTASY: NEGATIVE ng/mL
FENTANYL URINE: NEGATIVE ng/mL
METHADONE SCREEN, URINE: NEGATIVE ng/mL
METHAQUALONE SCREEN (URINE): NEGATIVE ng/mL
Meperidine, Ur: NEGATIVE ng/mL
Nitrites, Initial: NEGATIVE ug/mL
OXYCODONE SCRN UR: NEGATIVE ng/mL
Opiate Screen, Urine: NEGATIVE ng/mL
PHENCYCLIDINE, UR: NEGATIVE ng/mL
Propoxyphene: NEGATIVE ng/mL
TAPENTADOLUR: NEGATIVE ng/mL
Tramadol Scrn, Ur: NEGATIVE ng/mL
Zolpidem, Urine: NEGATIVE ng/mL
pH, Initial: 7.1 pH (ref 4.5–8.9)

## 2013-06-02 ENCOUNTER — Ambulatory Visit (HOSPITAL_COMMUNITY)
Admission: RE | Admit: 2013-06-02 | Discharge: 2013-06-02 | Disposition: A | Discharge status: Home / Self Care | Payer: Medicaid Other | Source: Ambulatory Visit | Attending: Family | Admitting: Family

## 2013-06-02 ENCOUNTER — Other Ambulatory Visit: Payer: Self-pay | Admitting: Family

## 2013-06-02 DIAGNOSIS — O0930 Supervision of pregnancy with insufficient antenatal care, unspecified trimester: Secondary | ICD-10-CM

## 2013-06-02 DIAGNOSIS — Z3689 Encounter for other specified antenatal screening: Secondary | ICD-10-CM | POA: Insufficient documentation

## 2013-06-03 ENCOUNTER — Inpatient Hospital Stay (HOSPITAL_BASED_OUTPATIENT_CLINIC_OR_DEPARTMENT_OTHER)
Admission: AD | Admit: 2013-06-03 | Discharge: 2013-06-04 | Disposition: A | Discharge status: Home / Self Care | Payer: Medicaid Other | Attending: Obstetrics and Gynecology | Admitting: Obstetrics and Gynecology

## 2013-06-03 ENCOUNTER — Encounter (HOSPITAL_BASED_OUTPATIENT_CLINIC_OR_DEPARTMENT_OTHER): Payer: Self-pay | Admitting: Emergency Medicine

## 2013-06-03 DIAGNOSIS — O99891 Other specified diseases and conditions complicating pregnancy: Secondary | ICD-10-CM | POA: Insufficient documentation

## 2013-06-03 DIAGNOSIS — O26839 Pregnancy related renal disease, unspecified trimester: Secondary | ICD-10-CM | POA: Insufficient documentation

## 2013-06-03 DIAGNOSIS — O0990 Supervision of high risk pregnancy, unspecified, unspecified trimester: Secondary | ICD-10-CM

## 2013-06-03 DIAGNOSIS — O9989 Other specified diseases and conditions complicating pregnancy, childbirth and the puerperium: Principal | ICD-10-CM

## 2013-06-03 DIAGNOSIS — N189 Chronic kidney disease, unspecified: Secondary | ICD-10-CM | POA: Insufficient documentation

## 2013-06-03 DIAGNOSIS — R109 Unspecified abdominal pain: Secondary | ICD-10-CM

## 2013-06-03 DIAGNOSIS — M549 Dorsalgia, unspecified: Secondary | ICD-10-CM | POA: Insufficient documentation

## 2013-06-03 DIAGNOSIS — R1031 Right lower quadrant pain: Secondary | ICD-10-CM | POA: Insufficient documentation

## 2013-06-03 DIAGNOSIS — O26899 Other specified pregnancy related conditions, unspecified trimester: Secondary | ICD-10-CM

## 2013-06-03 DIAGNOSIS — N200 Calculus of kidney: Secondary | ICD-10-CM | POA: Insufficient documentation

## 2013-06-03 HISTORY — DX: Influenza due to unidentified influenza virus with other respiratory manifestations: J11.1

## 2013-06-03 LAB — CBC WITH DIFFERENTIAL/PLATELET
Basophils Absolute: 0 10*3/uL (ref 0.0–0.1)
Basophils Relative: 0 % (ref 0–1)
EOS PCT: 2 % (ref 0–5)
Eosinophils Absolute: 0.2 10*3/uL (ref 0.0–0.7)
HEMATOCRIT: 32.7 % — AB (ref 36.0–46.0)
Hemoglobin: 11.1 g/dL — ABNORMAL LOW (ref 12.0–15.0)
LYMPHS ABS: 2.3 10*3/uL (ref 0.7–4.0)
LYMPHS PCT: 27 % (ref 12–46)
MCH: 31.6 pg (ref 26.0–34.0)
MCHC: 33.9 g/dL (ref 30.0–36.0)
MCV: 93.2 fL (ref 78.0–100.0)
Monocytes Absolute: 0.5 10*3/uL (ref 0.1–1.0)
Monocytes Relative: 6 % (ref 3–12)
Neutro Abs: 5.4 10*3/uL (ref 1.7–7.7)
Neutrophils Relative %: 64 % (ref 43–77)
PLATELETS: 170 10*3/uL (ref 150–400)
RBC: 3.51 MIL/uL — AB (ref 3.87–5.11)
RDW: 11.7 % (ref 11.5–15.5)
WBC: 8.4 10*3/uL (ref 4.0–10.5)

## 2013-06-03 LAB — URINALYSIS, ROUTINE W REFLEX MICROSCOPIC
Bilirubin Urine: NEGATIVE
GLUCOSE, UA: NEGATIVE mg/dL
HGB URINE DIPSTICK: NEGATIVE
KETONES UR: NEGATIVE mg/dL
Nitrite: NEGATIVE
PH: 7 (ref 5.0–8.0)
Protein, ur: NEGATIVE mg/dL
Specific Gravity, Urine: 1.013 (ref 1.005–1.030)
Urobilinogen, UA: 0.2 mg/dL (ref 0.0–1.0)

## 2013-06-03 LAB — COMPREHENSIVE METABOLIC PANEL
ALK PHOS: 42 U/L (ref 39–117)
ALT: 5 U/L (ref 0–35)
AST: 12 U/L (ref 0–37)
Albumin: 3 g/dL — ABNORMAL LOW (ref 3.5–5.2)
BUN: 7 mg/dL (ref 6–23)
CO2: 22 meq/L (ref 19–32)
Calcium: 8.7 mg/dL (ref 8.4–10.5)
Chloride: 101 mEq/L (ref 96–112)
Creatinine, Ser: 0.6 mg/dL (ref 0.50–1.10)
GLUCOSE: 100 mg/dL — AB (ref 70–99)
Potassium: 3.5 mEq/L — ABNORMAL LOW (ref 3.7–5.3)
Sodium: 138 mEq/L (ref 137–147)
Total Protein: 6.4 g/dL (ref 6.0–8.3)

## 2013-06-03 LAB — URINE MICROSCOPIC-ADD ON

## 2013-06-03 MED ORDER — MORPHINE SULFATE 2 MG/ML IJ SOLN
2.0000 mg | Freq: Once | INTRAMUSCULAR | Status: AC
  Administered 2013-06-03: 2 mg via INTRAVENOUS
  Filled 2013-06-03: qty 1

## 2013-06-03 MED ORDER — ONDANSETRON HCL 4 MG/2ML IJ SOLN
4.0000 mg | Freq: Once | INTRAMUSCULAR | Status: AC
  Administered 2013-06-03: 4 mg via INTRAVENOUS
  Filled 2013-06-03: qty 2

## 2013-06-03 NOTE — ED Notes (Addendum)
Pt states that this is her second pregnancy. Pt states she is [redacted] weeks pregnant Pt states that her first she had kidney stones and this feels similar. Pt states that she has been having intermitent pain in her back and abdomen. Pt denies vaginal bleeding.

## 2013-06-03 NOTE — Consult Note (Signed)
Phone call from Dr Blinda LeatherwoodPollina regarding this 23 yr old female with a 19 wk IUP, with rt sided back and flank pain, negative urinalysis and afebrile , with labs,(CBC,CMP) pending. [facility], and assessment re possible kidneystone. No obstetric c/o at present. Dr Reola CalkinsBeck aware, will eval at [facility] here.

## 2013-06-03 NOTE — MAU Note (Signed)
[facility] for R flank pain. Hx kidney stones.

## 2013-06-03 NOTE — ED Provider Notes (Signed)
CSN: [id number]     Arrival date & time 06/03/13  2117 History  This chart was scribed for Gilda Crease, * by Carl Best, ED Scribe. This patient was seen in room MH04/MH04 and the patient's care was started at 9:50 PM.     Chief Complaint  Patient presents with  . Back Pain  . Abdominal Pain    [redacted] weeks pregnant    Patient is a 23 y.o. female presenting with back pain and abdominal pain. The history is provided by the patient. No language interpreter was used.  Back Pain Associated symptoms: abdominal pain   Associated symptoms: no fever   Abdominal Pain Associated symptoms: no diarrhea, no fever and no vomiting    HPI Comments: Katherine Conrad is a 23 y.o. female who presents to the Emergency Department complaining of severe, right sided abdominal pain that started a couple of hours ago tonight.  She lists right-sided back pain as an associated symptom.  She denies emesis, diarrhea, and fever as associated symptoms.  She states that the pain does not radiate to her groin area.  She states that nothing aggravates or alleviates the pain.  The patient states that she is [redacted] weeks pregnant.  She states that she had an ultrasound this week and was told that her placenta is enlarged.  She states that she has experienced this pain before but the pain will usually subside by itself.  The patient states that she still has an appendix.    Past Medical History  Diagnosis Date  . Pneumonia   . Chronic kidney disease     Kidney stones  . Flu    Past Surgical History  Procedure Laterality Date  . Tonsillectomy    . Laparoscopic abdominal exploration     History reviewed. No pertinent family history. History  Substance Use Topics  . Smoking status: Never Smoker   . Smokeless tobacco: Never Used  . Alcohol Use: Yes   OB History   Grav Para Term Preterm Abortions TAB SAB Ect Mult Living   2 1 1       1      Review of Systems  Constitutional: Negative for fever.   Gastrointestinal: Positive for abdominal pain. Negative for vomiting and diarrhea.  Musculoskeletal: Positive for back pain.  All other systems reviewed and are negative.    Allergies  Review of patient's allergies indicates no known allergies.  Home Medications   Current Outpatient Rx  Name  Route  Sig  Dispense  Refill  . Prenatal Vit-Fe Fumarate-FA (PRENATAL MULTIVITAMIN) TABS tablet   Oral   Take 1 tablet by mouth daily at 12 noon.         Marland Kitchen HYDROcodone-acetaminophen (NORCO) 5-325 MG per tablet   Oral   Take 2 tablets by mouth every 4 (four) hours as needed.   12 tablet   0   . HYDROcodone-acetaminophen (NORCO/VICODIN) 5-325 MG per tablet   Oral   Take 2 tablets by mouth every 4 (four) hours as needed.   12 tablet   0   . ondansetron (ZOFRAN ODT) 4 MG disintegrating tablet   Oral   Take 1 tablet (4 mg total) by mouth every 8 (eight) hours as needed for nausea or vomiting.   10 tablet   0   . oseltamivir (TAMIFLU) 75 MG capsule   Oral   Take 1 capsule (75 mg total) by mouth every 12 (twelve) hours.   10 capsule   0  Triage Vitals: BP 113/79  Pulse 133  Temp(Src) 98 F (36.7 C) (Oral)  Resp 18  Ht 5\' 7"  (1.702 m)  Wt 140 lb (63.504 kg)  BMI 21.92 kg/m2  SpO2 100%  LMP 01/03/2013  Physical Exam  Constitutional: She is oriented to person, place, and time. She appears well-developed and well-nourished. No distress.  HENT:  Head: Normocephalic and atraumatic.  Right Ear: Hearing normal.  Left Ear: Hearing normal.  Nose: Nose normal.  Mouth/Throat: Oropharynx is clear and moist and mucous membranes are normal.  Eyes: Conjunctivae and EOM are normal. Pupils are equal, round, and reactive to light.  Neck: Normal range of motion. Neck supple.  Cardiovascular: Regular rhythm, S1 normal and S2 normal.  Exam reveals no gallop and no friction rub.   No murmur heard. Pulmonary/Chest: Effort normal and breath sounds normal. No respiratory distress. She  exhibits no tenderness.  Abdominal: Soft. Normal appearance and bowel sounds are normal. There is no hepatosplenomegaly. There is tenderness in the right lower quadrant. There is CVA tenderness (right). There is no rebound, no guarding, no tenderness at McBurney's point and negative Murphy's sign. No hernia.  Musculoskeletal: Normal range of motion.  Neurological: She is alert and oriented to person, place, and time. She has normal strength. No cranial nerve deficit or sensory deficit. Coordination normal. GCS eye subscore is 4. GCS verbal subscore is 5. GCS motor subscore is 6.  Skin: Skin is warm, dry and intact. No rash noted. No cyanosis.  Psychiatric: She has a normal mood and affect. Her speech is normal and behavior is normal. Thought content normal.    ED Course  Procedures (including critical care time)  DIAGNOSTIC STUDIES: Oxygen Saturation is 100% on room air, normal by my interpretation.    COORDINATION OF CARE: 9:54 PM- Discussed obtaining blood work and a urinalysis before transferring the patient to another hospital to have imaging done.  The patient agreed to the treatment plan.    Labs Review Labs Reviewed  URINALYSIS, ROUTINE W REFLEX MICROSCOPIC   Imaging Review US Ob Comp + 14 Wk  06/02/2013   OBSTETRICAL ULTRASOUND: This exam was performed within a [facility]. The OB US report was generated in the [facility] system, and faxed to the ordering physician.   This report is also available in [employer] and in the [employer]. See [facility] Obstetric US report.   EKG Interpretation   None       MDM  Diagnosis: 1. Back pain 2. Abdominal pain 3. Pregnancy  Patient presents to the ER for evaluation of pain in the right back in the abdomen. Patient is [redacted] weeks pregnant. She just had her first prenatal visit 2 weeks ago. She was told that it was low. She does not have any vaginal bleeding. Examination revealed diffuse right-sided  tenderness. There is tenderness in the right back and CVA area, the patient had significant tenderness in the right lower quadrant of the abdomen as well.  Patient reports previous history of any stones with previous pregnancy. She has been having intermittent pain and cramping in the back and abdomen recently, but this has only been lasting for minutes. She has had significantly intensified pain tonight that has lasted for hours. This does raise the possibility of kidney stone. Urinalysis, however, does not show any obvious infection and there is no blood. This does not rule out renal colic, but also warrants evaluation for other etiology. I have low suspicion for complications of pregnancy.  Appendicitis is a consideration.  I discussed the case with Doctor Emelda FearFerguson[facility]. He recommended transferring the patient to a mental hospital for further evaluation. Ultrasound can be performed to rule out appendicitis as well as to evaluate for possible kidney stone. Ultrasound is not available here tonight.  Patient was distressed secondary to her pain. She was given a small dose of morphine here in the ER.  I personally performed the services described in this documentation, which was scribed in my presence. The recorded information has been reviewed and is accurate.    Gilda Creasehristopher J. Pollina, MD 06/03/13 2229

## 2013-06-04 ENCOUNTER — Inpatient Hospital Stay (HOSPITAL_COMMUNITY): Payer: Medicaid Other

## 2013-06-04 ENCOUNTER — Encounter (HOSPITAL_COMMUNITY): Payer: Self-pay | Admitting: *Deleted

## 2013-06-04 DIAGNOSIS — R109 Unspecified abdominal pain: Secondary | ICD-10-CM

## 2013-06-04 DIAGNOSIS — O26899 Other specified pregnancy related conditions, unspecified trimester: Secondary | ICD-10-CM

## 2013-06-04 MED ORDER — MORPHINE SULFATE 4 MG/ML IJ SOLN
2.0000 mg | Freq: Once | INTRAMUSCULAR | Status: AC
  Administered 2013-06-04: 2 mg via INTRAVENOUS
  Filled 2013-06-04: qty 1

## 2013-06-04 MED ORDER — OXYCODONE-ACETAMINOPHEN 5-325 MG PO TABS: 1.0000 | ORAL_TABLET | Freq: Four times a day (QID) | ORAL | Status: DC | PRN

## 2013-06-04 NOTE — Discharge Instructions (Signed)
Kidney Stones  Kidney stones (urolithiasis) are deposits that form inside your kidneys. The intense pain is caused by the stone moving through the urinary tract. When the stone moves, the ureter goes into spasm around the stone. The stone is usually passed in the urine.   CAUSES   · A disorder that makes certain neck glands produce too much parathyroid hormone (primary hyperparathyroidism).  · A buildup of uric acid crystals, similar to gout in your joints.  · Narrowing (stricture) of the ureter.  · A kidney obstruction present at birth (congenital obstruction).  · Previous surgery on the kidney or ureters.  · Numerous kidney infections.  SYMPTOMS   · Feeling sick to your stomach (nauseous).  · Throwing up (vomiting).  · Blood in the urine (hematuria).  · Pain that usually spreads (radiates) to the groin.  · Frequency or urgency of urination.  DIAGNOSIS   · Taking a history and physical exam.  · Blood or urine tests.  · CT scan.  · Occasionally, an examination of the inside of the urinary bladder (cystoscopy) is performed.  TREATMENT   · Observation.  · Increasing your fluid intake.  · Extracorporeal shock wave lithotripsy This is a noninvasive procedure that uses shock waves to break up kidney stones.  · Surgery may be needed if you have severe pain or persistent obstruction. There are various surgical procedures. Most of the procedures are performed with the use of small instruments. Only small incisions are needed to accommodate these instruments, so recovery time is minimized.  The size, location, and chemical composition are all important variables that will determine the proper choice of action for you. Talk to your health care provider to better understand your situation so that you will minimize the risk of injury to yourself and your kidney.   HOME CARE INSTRUCTIONS   · Drink enough water and fluids to keep your urine clear or pale yellow. This will help you to pass the stone or stone fragments.  · Strain  all urine through the provided strainer. Keep all particulate matter and stones for your health care provider to see. The stone causing the pain may be as small as a grain of salt. It is very important to use the strainer each and every time you pass your urine. The collection of your stone will allow your health care provider to analyze it and verify that a stone has actually passed. The stone analysis will often identify what you can do to reduce the incidence of recurrences.  · Only take over-the-counter or prescription medicines for pain, discomfort, or fever as directed by your health care provider.  · Make a follow-up appointment with your health care provider as directed.  · Get follow-up X-rays if required. The absence of pain does not always mean that the stone has passed. It may have only stopped moving. If the urine remains completely obstructed, it can cause loss of kidney function or even complete destruction of the kidney. It is your responsibility to make sure X-rays and follow-ups are completed. Ultrasounds of the kidney can show blockages and the status of the kidney. Ultrasounds are not associated with any radiation and can be performed easily in a matter of minutes.  SEEK MEDICAL CARE IF:  · You experience pain that is progressive and unresponsive to any pain medicine you have been prescribed.  SEEK IMMEDIATE MEDICAL CARE IF:   · Pain cannot be controlled with the prescribed medicine.  · You have a fever   or shaking chills.  · The severity or intensity of pain increases over 18 hours and is not relieved by pain medicine.  · You develop a new onset of abdominal pain.  · You feel faint or pass out.  · You are unable to urinate.  MAKE SURE YOU:   · Understand these instructions.  · Will watch your condition.  · Will get help right away if you are not doing well or get worse.  Document Released: 04/28/2005 Document Revised: 12/29/2012 Document Reviewed: 09/29/2012  ExitCare® Patient Information ©2014  [employer].

## 2013-06-04 NOTE — MAU Provider Note (Signed)
Chief Complaint:  Back Pain and Abdominal Pain   Katherine Conrad is a 23 y.o.  G2P1001 with IUP at [redacted]w[redacted]d presenting for Back Pain and Abdominal Pain  Pt presented to the ED at [facility] with right sided severe abdominal and back pain.  Nothing seems to improve it or worsen it.  Says it is similar to previous pain when she had a kidney stone with her last pregnancy.  No abdominal surgery.  She was transferred here for further workup due to pregnancy.  Pt states that she has had back and flank pain on that side for the last couple weeks that would come and go but then in the last day it has become constant.  Sharp pain that takes her breath away. Relieved slightly with vicodin.    [place]bc, cmp and UA were all normal.   No fevers, chills, nausea, vomiting, diarrhea, constipation, weakness, dysuria, hematuria, increased urinary frequency.      Menstrual History: OB History   Grav Para Term Preterm Abortions TAB SAB Ect Mult Living   2 1 1       1        Patient's last menstrual period was 01/03/2013.      Past Medical History  Diagnosis Date  . Pneumonia   . Chronic kidney disease     Kidney stones  . Flu     Past Surgical History  Procedure Laterality Date  . Tonsillectomy    . Laparoscopic abdominal exploration      History reviewed. No pertinent family history.  History  Substance Use Topics  . Smoking status: Never Smoker   . Smokeless tobacco: Never Used  . Alcohol Use: Yes     No Known Allergies  Prescriptions prior to admission  Medication Sig Dispense Refill  . Prenatal Vit-Fe Fumarate-FA (PRENATAL MULTIVITAMIN) TABS tablet Take 1 tablet by mouth daily at 12 noon.      Marland Kitchen HYDROcodone-acetaminophen (NORCO) 5-325 MG per tablet Take 2 tablets by mouth every 4 (four) hours as needed.  12 tablet  0  . HYDROcodone-acetaminophen (NORCO/VICODIN) 5-325 MG per tablet Take 2 tablets by mouth every 4 (four) hours as needed.  12 tablet   0  . ondansetron (ZOFRAN ODT) 4 MG disintegrating tablet Take 1 tablet (4 mg total) by mouth every 8 (eight) hours as needed for nausea or vomiting.  10 tablet  0  . oseltamivir (TAMIFLU) 75 MG capsule Take 1 capsule (75 mg total) by mouth every 12 (twelve) hours.  10 capsule  0    Review of Systems - Negative except for what is mentioned in HPI.  Physical Exam  Blood pressure 100/62, pulse 81, temperature 97.7 F (36.5 C), temperature source Oral, resp. rate 18, height 5\' 7"  (1.702 m), weight 68.856 kg (151 lb 12.8 oz), last menstrual period 01/03/2013, SpO2 100.00%. GENERAL: Well-developed, well-nourished female in no acute distress.  LUNGS: Clear to auscultation bilaterally.  HEART: Regular rate and rhythm. ABDOMEN: Soft, mildly tender with deep palpation on RLQ, nondistended, gravid.  No CVA tenderness.  EXTREMITIES: Nontender, no edema, 2+ distal pulses.  FHT:  Doppler 145    Labs: Results for orders placed during the hospital encounter of 06/03/13 (from the past 24 hour(s))  URINALYSIS, ROUTINE W REFLEX MICROSCOPIC   Collection Time    06/03/13  9:36 PM      Result Value Range   Color, Urine YELLOW  YELLOW   APPearance CLOUDY (*) CLEAR   Specific  Gravity, Urine 1.013  1.005 - 1.030   pH 7.0  5.0 - 8.0   Glucose, UA NEGATIVE  NEGATIVE mg/dL   Hgb urine dipstick NEGATIVE  NEGATIVE   Bilirubin Urine NEGATIVE  NEGATIVE   Ketones, ur NEGATIVE  NEGATIVE mg/dL   Protein, ur NEGATIVE  NEGATIVE mg/dL   Urobilinogen, UA 0.2  0.0 - 1.0 mg/dL   Nitrite NEGATIVE  NEGATIVE   Leukocytes, UA SMALL (*) NEGATIVE  URINE MICROSCOPIC-ADD ON   Collection Time    06/03/13  9:36 PM      Result Value Range   Squamous Epithelial / LPF FEW (*) RARE   WBC, UA 3-6  <3 WBC/hpf   Bacteria, UA RARE  RARE  CBC WITH DIFFERENTIAL   Collection Time    06/03/13 10:14 PM      Result Value Range   WBC 8.4  4.0 - 10.5 K/uL   RBC 3.51 (*) 3.87 - 5.11 MIL/uL   Hemoglobin 11.1 (*) 12.0 - 15.0 g/dL    HCT 62.132.7 (*) 30.836.0 - 46.0 %   MCV 93.2  78.0 - 100.0 fL   MCH 31.6  26.0 - 34.0 pg   MCHC 33.9  30.0 - 36.0 g/dL   RDW 65.711.7  84.611.5 - 96.215.5 %   Platelets 170  150 - 400 K/uL   Neutrophils Relative % 64  43 - 77 %   Neutro Abs 5.4  1.7 - 7.7 K/uL   Lymphocytes Relative 27  12 - 46 %   Lymphs Abs 2.3  0.7 - 4.0 K/uL   Monocytes Relative 6  3 - 12 %   Monocytes Absolute 0.5  0.1 - 1.0 K/uL   Eosinophils Relative 2  0 - 5 %   Eosinophils Absolute 0.2  0.0 - 0.7 K/uL   Basophils Relative 0  0 - 1 %   Basophils Absolute 0.0  0.0 - 0.1 K/uL  COMPREHENSIVE METABOLIC PANEL   Collection Time    06/03/13 10:14 PM      Result Value Range   Sodium 138  137 - 147 mEq/L   Potassium 3.5 (*) 3.7 - 5.3 mEq/L   Chloride 101  96 - 112 mEq/L   CO2 22  19 - 32 mEq/L   Glucose, Bld 100 (*) 70 - 99 mg/dL   BUN 7  6 - 23 mg/dL   Creatinine, Ser 9.520.60  0.50 - 1.10 mg/dL   Calcium 8.7  8.4 - 84.110.5 mg/dL   Total Protein 6.4  6.0 - 8.3 g/dL   Albumin 3.0 (*) 3.5 - 5.2 g/dL   AST 12  0 - 37 U/L   ALT 5  0 - 35 U/L   Alkaline Phosphatase 42  39 - 117 U/L   Total Bilirubin <0.2 (*) 0.3 - 1.2 mg/dL   GFR calc non Af Amer >90  >90 mL/min   GFR calc Af Amer >90  >90 mL/min    Imaging Studies:  [place] Ob Comp + 14 Wk  06/02/2013   OBSTETRICAL ULTRASOUND: This exam was performed within a [facility]. The OB US report was generated in the [facility] system, and faxed to the ordering physician.   This report is also available in [facility]'s [employer] and in the [employer] PACS. See [facility] Obstetric US report.  [place] Renal  06/04/2013   CLINICAL DATA:  Right lower quadrant abdominal pain, [redacted] weeks pregnant.  EXAM: RENAL/URINARY TRACT ULTRASOUND COMPLETE  COMPARISON:  None.  FINDINGS: Right Kidney:  Length: 11.3 cm. Echogenicity within normal limits. Mild hydronephrosis visualized. No mass.  Left Kidney:  Length: 10.2 cm. Echogenicity within normal limits. No mass or hydronephrosis visualized.  Bladder:   Appears normal for degree of bladder distention.  IMPRESSION: 1. Mild right hydronephrosis.   Electronically Signed   By: Signa Kell M.D.   On: 06/04/2013 01:55    Assessment: Katherine Conrad is  23 y.o. G2P1001 at [redacted]w[redacted]d presents with Back Pain and Abdominal Pain pain likely due to nephrolithiasis given hx and progression of symptoms.  No stone seen on [place] although this is certainly not uncommon.  Cr normal. Discussed with Dr. Emelda Fear who reviewed the [place] and said it was without evidence of obstruction and mild right sided hydronephrosis likely more physiologic in pregnancy. Abdomen benign on exam in the setting of normal labs making things like appendicitis or cholecystitis highly unlikely.   Plan: - discussed above with pt - rx of percocet for pain control given - recommend continued management at home for now - f/u in clinic first part of next week to ensure improvement ( flag sent to front desk) - should symptoms worsen, return to MAU and may need to consider more diagnostic imaging at that time.    Case and care plan discussed with Dr. Emelda Fear who was in agreement with above.     Katherine Conrad L MD 1/24/20152:06 AM

## 2013-06-05 ENCOUNTER — Encounter: Payer: Self-pay | Admitting: Family

## 2013-06-08 NOTE — MAU Provider Note (Signed)
`````  Attestation of Attending Supervision of Advanced Practitioner: Evaluation and management procedures were performed by the PA/NP/CNM/OB Fellow under my supervision/collaboration. Chart reviewed and agree with management and plan.  Hutton Pellicane V 06/08/2013 5:19 PM

## 2013-06-12 ENCOUNTER — Inpatient Hospital Stay (HOSPITAL_COMMUNITY): Payer: Medicaid Other

## 2013-06-12 ENCOUNTER — Inpatient Hospital Stay (HOSPITAL_BASED_OUTPATIENT_CLINIC_OR_DEPARTMENT_OTHER)
Admission: EM | Admit: 2013-06-12 | Discharge: 2013-06-12 | Disposition: A | Discharge status: Other Acute Inpatient Hospital | Payer: Medicaid Other | Attending: Family Medicine | Admitting: Family Medicine

## 2013-06-12 ENCOUNTER — Encounter (HOSPITAL_BASED_OUTPATIENT_CLINIC_OR_DEPARTMENT_OTHER): Payer: Self-pay | Admitting: Emergency Medicine

## 2013-06-12 DIAGNOSIS — R350 Frequency of micturition: Secondary | ICD-10-CM | POA: Insufficient documentation

## 2013-06-12 DIAGNOSIS — O9989 Other specified diseases and conditions complicating pregnancy, childbirth and the puerperium: Secondary | ICD-10-CM | POA: Insufficient documentation

## 2013-06-12 DIAGNOSIS — Z87442 Personal history of urinary calculi: Secondary | ICD-10-CM | POA: Insufficient documentation

## 2013-06-12 DIAGNOSIS — R110 Nausea: Secondary | ICD-10-CM | POA: Insufficient documentation

## 2013-06-12 DIAGNOSIS — O208 Other hemorrhage in early pregnancy: Secondary | ICD-10-CM | POA: Insufficient documentation

## 2013-06-12 DIAGNOSIS — Z79899 Other long term (current) drug therapy: Secondary | ICD-10-CM | POA: Insufficient documentation

## 2013-06-12 DIAGNOSIS — M549 Dorsalgia, unspecified: Secondary | ICD-10-CM | POA: Insufficient documentation

## 2013-06-12 DIAGNOSIS — Z8701 Personal history of pneumonia (recurrent): Secondary | ICD-10-CM | POA: Insufficient documentation

## 2013-06-12 DIAGNOSIS — O26899 Other specified pregnancy related conditions, unspecified trimester: Secondary | ICD-10-CM

## 2013-06-12 DIAGNOSIS — R109 Unspecified abdominal pain: Secondary | ICD-10-CM | POA: Insufficient documentation

## 2013-06-12 DIAGNOSIS — N189 Chronic kidney disease, unspecified: Secondary | ICD-10-CM | POA: Insufficient documentation

## 2013-06-12 DIAGNOSIS — O209 Hemorrhage in early pregnancy, unspecified: Secondary | ICD-10-CM

## 2013-06-12 LAB — CBC WITH DIFFERENTIAL/PLATELET
BASOS PCT: 0 % (ref 0–1)
Basophils Absolute: 0 10*3/uL (ref 0.0–0.1)
EOS ABS: 0.1 10*3/uL (ref 0.0–0.7)
EOS PCT: 1 % (ref 0–5)
HCT: 34.9 % — ABNORMAL LOW (ref 36.0–46.0)
Hemoglobin: 11.6 g/dL — ABNORMAL LOW (ref 12.0–15.0)
LYMPHS ABS: 1.6 10*3/uL (ref 0.7–4.0)
Lymphocytes Relative: 16 % (ref 12–46)
MCH: 31.4 pg (ref 26.0–34.0)
MCHC: 33.2 g/dL (ref 30.0–36.0)
MCV: 94.3 fL (ref 78.0–100.0)
Monocytes Absolute: 0.7 10*3/uL (ref 0.1–1.0)
Monocytes Relative: 7 % (ref 3–12)
Neutro Abs: 7.7 10*3/uL (ref 1.7–7.7)
Neutrophils Relative %: 76 % (ref 43–77)
Platelets: 164 10*3/uL (ref 150–400)
RBC: 3.7 MIL/uL — AB (ref 3.87–5.11)
RDW: 12 % (ref 11.5–15.5)
WBC: 10.2 10*3/uL (ref 4.0–10.5)

## 2013-06-12 LAB — URINALYSIS, ROUTINE W REFLEX MICROSCOPIC
Bilirubin Urine: NEGATIVE
Glucose, UA: NEGATIVE mg/dL
Ketones, ur: NEGATIVE mg/dL
Leukocytes, UA: NEGATIVE
Nitrite: NEGATIVE
PROTEIN: NEGATIVE mg/dL
SPECIFIC GRAVITY, URINE: 1.011 (ref 1.005–1.030)
Urobilinogen, UA: 0.2 mg/dL (ref 0.0–1.0)
pH: 7.5 (ref 5.0–8.0)

## 2013-06-12 LAB — URINE MICROSCOPIC-ADD ON

## 2013-06-12 LAB — COMPREHENSIVE METABOLIC PANEL
ALBUMIN: 3.4 g/dL — AB (ref 3.5–5.2)
ALT: 6 U/L (ref 0–35)
AST: 11 U/L (ref 0–37)
Alkaline Phosphatase: 47 U/L (ref 39–117)
BUN: 7 mg/dL (ref 6–23)
CALCIUM: 9.2 mg/dL (ref 8.4–10.5)
CO2: 24 mEq/L (ref 19–32)
Chloride: 103 mEq/L (ref 96–112)
Creatinine, Ser: 0.5 mg/dL (ref 0.50–1.10)
GFR calc Af Amer: >90 mL/min (ref >90)
GFR calc non Af Amer: >90 mL/min (ref >90)
GLUCOSE: 83 mg/dL (ref 70–99)
Potassium: 4.2 mEq/L (ref 3.7–5.3)
SODIUM: 138 meq/L (ref 137–147)
TOTAL PROTEIN: 7.1 g/dL (ref 6.0–8.3)
Total Bilirubin: 0.4 mg/dL (ref 0.3–1.2)

## 2013-06-12 LAB — WET PREP, GENITAL
TRICH WET PREP: NONE SEEN
YEAST WET PREP: NONE SEEN

## 2013-06-12 MED ORDER — SODIUM CHLORIDE 0.9 % IV SOLN: INTRAVENOUS | Status: DC

## 2013-06-12 MED ORDER — HYDROMORPHONE HCL PF 1 MG/ML IJ SOLN
0.5000 mg | Freq: Once | INTRAMUSCULAR | Status: AC
  Administered 2013-06-12: 0.5 mg via INTRAVENOUS
  Filled 2013-06-12: qty 1

## 2013-06-12 MED ORDER — METRONIDAZOLE 500 MG PO TABS: 500.0000 mg | ORAL_TABLET | Freq: Two times a day (BID) | ORAL | Status: DC

## 2013-06-12 MED ORDER — ONDANSETRON HCL 4 MG/2ML IJ SOLN
4.0000 mg | Freq: Once | INTRAMUSCULAR | Status: AC
  Administered 2013-06-12: 4 mg via INTRAVENOUS
  Filled 2013-06-12: qty 2

## 2013-06-12 MED ORDER — OXYCODONE-ACETAMINOPHEN 5-325 MG PO TABS: 2.0000 | ORAL_TABLET | ORAL | Status: DC | PRN

## 2013-06-12 NOTE — MAU Provider Note (Signed)
Attestation of Attending Supervision of Advanced Practitioner (PA/CNM/NP): Evaluation and management procedures were performed by the Advanced Practitioner under my supervision and collaboration.  I have reviewed the Advanced Practitioner's note and chart, and I agree with the management and plan.  Milani Lowenstein S, MD [facility] Attending 06/12/2013 6:02 PM   

## 2013-06-12 NOTE — Progress Notes (Signed)
Dr. Ike Benedom notified of pt's u/s results, all wnl. Will come see pt shortly.

## 2013-06-12 NOTE — Progress Notes (Signed)
Dr. Ike Benedom notified pt here from [facility]. Will place order for ob u/s

## 2013-06-12 NOTE — ED Provider Notes (Signed)
CSN: [id number]     Arrival date & time 06/12/13  1316 History   First MD Initiated Contact with Patient 06/12/13 1318     Chief Complaint  Patient presents with  . Abdominal Pain   (Consider location/radiation/quality/duration/timing/severity/associated sxs/prior Treatment) HPI Katherine Conrad is a 23 y.o. female @ 7078w1d gestation who presents to the ED with vaginal bleeding and abdominal cramping. Has was diagnosed with a low lying placenta and has had spotting off and on for the past few months.  She was evaluated last week here and [facility] and diagnoses with kidney stones. Today she reports low abdominal cramping that is severe and increased vaginal bleeding.   Past Medical History  Diagnosis Date  . Pneumonia   . Chronic kidney disease     Kidney stones  . Flu    Past Surgical History  Procedure Laterality Date  . Tonsillectomy    . Laparoscopic abdominal exploration     No family history on file. History  Substance Use Topics  . Smoking status: Never Smoker   . Smokeless tobacco: Never Used  . Alcohol Use: Yes   OB History   Grav Para Term Preterm Abortions TAB SAB Ect Mult Living   2 1 1       1      Review of Systems  Constitutional: Negative for fever and chills.  HENT: Negative.   Eyes: Negative for visual disturbance.  Respiratory: Negative for cough and shortness of breath.   Cardiovascular: Negative for chest pain.  Gastrointestinal: Positive for nausea and abdominal pain.  Genitourinary: Positive for frequency and vaginal bleeding. Negative for dysuria.  Musculoskeletal: Positive for back pain.  Skin: Negative for rash.  Neurological: Negative for dizziness and headaches.  Psychiatric/Behavioral: The patient is not nervous/anxious.     Allergies  Review of patient's allergies indicates no known allergies.  Home Medications   Current Outpatient Rx  Name  Route  Sig  Dispense  Refill  . ondansetron (ZOFRAN ODT) 4 MG disintegrating  tablet   Oral   Take 1 tablet (4 mg total) by mouth every 8 (eight) hours as needed for nausea or vomiting.   10 tablet   0   . oxyCODONE-acetaminophen (PERCOCET/ROXICET) 5-325 MG per tablet   Oral   Take 1-2 tablets by mouth every 6 (six) hours as needed.   30 tablet   0   . Prenatal Vit-Fe Fumarate-FA (PRENATAL MULTIVITAMIN) TABS tablet   Oral   Take 1 tablet by mouth daily at 12 noon.          BP 118/81  Pulse 88  Temp(Src) 98.1 F (36.7 C) (Oral)  Resp 18  Ht 5\' 7"  (1.702 m)  Wt 147 lb (66.679 kg)  BMI 23.02 kg/m2  SpO2 100%  LMP 01/03/2013 Physical Exam  Nursing note and vitals reviewed. Constitutional: She is oriented to person, place, and time. She appears well-developed and well-nourished.  HENT:  Head: Normocephalic and atraumatic.  Eyes: Conjunctivae and EOM are normal.  Neck: Normal range of motion. Neck supple.  Cardiovascular: Normal rate.   Pulmonary/Chest: Effort normal.  Abdominal: Soft. There is no tenderness.  Positive FHT's 150's  Genitourinary:  External genitalia without lesions. Moderate blood vaginal vault. Cervix closed, thick. No CMT, no adnexal tenderness. Uterus consistent with dates.   Musculoskeletal: Normal range of motion.  Neurological: She is alert and oriented to person, place, and time. No cranial nerve deficit.  Skin: Skin is warm and dry.  Psychiatric: She  has a normal mood and affect. Her behavior is normal.    ED Course: I spoke with Dr. Shawnie Pons on call for OB at [facility] and she will accept the patient in the MAU.   Procedures  MDM  23 y.o. female with vaginal bleeding in second trimester pregnancy. IV normal saline @ 125/hr. Dilaudid 0.5mg  IV for pain and Zofran 4 mg IV for nausea. [facility] and further evaluation of her pain and bleeding.  Discussed with the patient and all questioned fully answered. Stable for transport to [facility] hospital without signs of imminent delivery as cervix is  closed and patient is not hemorrhaging. Will transfer via [facility].     Janne Napoleon, [place] 06/12/13 [id number]

## 2013-06-12 NOTE — Discharge Instructions (Signed)
Abdominal Pain During Pregnancy °Abdominal pain is common in pregnancy. Most of the time, it does not cause harm. There are many causes of abdominal pain. Some causes are more serious than others. Some of the causes of abdominal pain in pregnancy are easily diagnosed. Occasionally, the diagnosis takes time to understand. Other times, the cause is not determined. Abdominal pain can be a sign that something is very wrong with the pregnancy, or the pain may have nothing to do with the pregnancy at all. For this reason, always tell your health care provider if you have any abdominal discomfort. °HOME CARE INSTRUCTIONS  °Monitor your abdominal pain for any changes. The following actions may help to alleviate any discomfort you are experiencing: °· Do not have sexual intercourse or put anything in your vagina until your symptoms go away completely. °· Get plenty of rest until your pain improves. °· Drink clear fluids if you feel nauseous. Avoid solid food as long as you are uncomfortable or nauseous. °· Only take over-the-counter or prescription medicine as directed by your health care provider. °· Keep all follow-up appointments with your health care provider. °SEEK IMMEDIATE MEDICAL CARE IF: °· You are bleeding, leaking fluid, or passing tissue from the vagina. °· You have increasing pain or cramping. °· You have persistent vomiting. °· You have painful or bloody urination. °· You have a fever. °· You notice a decrease in your baby's movements. °· You have extreme weakness or feel faint. °· You have shortness of breath, with or without abdominal pain. °· You develop a severe headache with abdominal pain. °· You have abnormal vaginal discharge with abdominal pain. °· You have persistent diarrhea. °· You have abdominal pain that continues even after rest, or gets worse. °MAKE SURE YOU:  °· Understand these instructions. °· Will watch your condition. °· Will get help right away if you are not doing well or get  worse. °Document Released: 04/28/2005 Document Revised: 02/16/2013 Document Reviewed: 11/25/2012 °ExitCare® Patient Information ©2014 [employer]. ° °

## 2013-06-12 NOTE — ED Notes (Signed)
Patient is 5 months pregnant, she was seen [facility] at [facility] and dx with kidney stones. States that she is having cramping in her lower abd that radiates to her right flank area. States that she is bleeding, states that it is "a lot", but she is not wearing a pad at this time. Tearful in triage.

## 2013-06-12 NOTE — MAU Provider Note (Signed)
None     Chief Complaint:  Abdominal Pain   Katherine Conrad is  23 y.o. G2P1001 at [redacted]w[redacted]d presents complaining of Abdominal Pain and vaginal bleeding that was heavier than a period. Pt states bleeding occurred immediately post coital and has slowly decreased since onset. Pt also had some lower uterine cramping. Normal fetal movement, no LOF, intermittent ctx improved now with just a mild constant right lower abdominal pain that wraps to back that is similar to prior.  Of note pt was diagnosed with likely right sided nephrolithiasis that has persisted since onset last week. Pt was taking percocet this past week with good pain control. She ran out on Friday and the pain has slowly gotten more painful to now a sharp pain ~6/10 right lower back and flank pain.   Obstetrical/Gynecological History: OB History   Grav Para Term Preterm Abortions TAB SAB Ect Mult Living   2 1 1       1      Past Medical History: Past Medical History  Diagnosis Date  . Pneumonia   . Chronic kidney disease     Kidney stones  . Flu     Past Surgical History: Past Surgical History  Procedure Laterality Date  . Tonsillectomy    . Laparoscopic abdominal exploration      Family History: History reviewed. No pertinent family history.  Social History: History  Substance Use Topics  . Smoking status: Never Smoker   . Smokeless tobacco: Never Used  . Alcohol Use: No    Allergies: No Known Allergies  Meds:  Prescriptions prior to admission  Medication Sig Dispense Refill  . Prenatal Vit-Fe Fumarate-FA (PRENATAL MULTIVITAMIN) TABS tablet Take 1 tablet by mouth daily at 12 noon.        Review of Systems -   Review of Systems  No F/c, HA, vision changes, CP, N/V, d/c, epigastric pain or other complaints.   Physical Exam  Blood pressure 111/61, pulse 83, temperature 97.6 F (36.4 C), temperature source Oral, resp. rate 18, height 5\' 7"  (1.702 m), weight 66.679 kg (147 lb), SpO2 100.00%. GENERAL:  Well-developed, well-nourished female in no acute distress.   ABDOMEN: Soft, moderate tenderness in RLQ and Right flank, nondistended, gravid.  EXTREMITIES: Nontender, no edema, 2+ distal pulses. DTR's 2+  cervix closed and thick on SSE Dark red blood in vault, mild Punctate lesions on right cervix, white discharge from closed os  Presentation: Breech [place] FHT:  Baseline rate 152 bpm   Contractions: no ctx on monitor   Labs: Results for orders placed during the hospital encounter of 06/12/13 (from the past 24 hour(s))  URINALYSIS, ROUTINE W REFLEX MICROSCOPIC   Collection Time    06/12/13  2:07 PM      Result Value Range   Color, Urine YELLOW  YELLOW   APPearance CLEAR  CLEAR   Specific Gravity, Urine 1.011  1.005 - 1.030   pH 7.5  5.0 - 8.0   Glucose, UA NEGATIVE  NEGATIVE mg/dL   Hgb urine dipstick LARGE (*) NEGATIVE   Bilirubin Urine NEGATIVE  NEGATIVE   Ketones, ur NEGATIVE  NEGATIVE mg/dL   Protein, ur NEGATIVE  NEGATIVE mg/dL   Urobilinogen, UA 0.2  0.0 - 1.0 mg/dL   Nitrite NEGATIVE  NEGATIVE   Leukocytes, UA NEGATIVE  NEGATIVE  URINE MICROSCOPIC-ADD ON   Collection Time    06/12/13  2:07 PM      Result Value Range   Squamous Epithelial / LPF RARE  RARE  RBC / HPF 3-6  <3 RBC/hpf   Bacteria, UA RARE  RARE   Urine-Other MUCOUS PRESENT    CBC WITH DIFFERENTIAL   Collection Time    06/12/13  2:15 PM      Result Value Range   WBC 10.2  4.0 - 10.5 K/uL   RBC 3.70 (*) 3.87 - 5.11 MIL/uL   Hemoglobin 11.6 (*) 12.0 - 15.0 g/dL   HCT 64.434.9 (*) 03.436.0 - 74.246.0 %   MCV 94.3  78.0 - 100.0 fL   MCH 31.4  26.0 - 34.0 pg   MCHC 33.2  30.0 - 36.0 g/dL   RDW 59.512.0  63.811.5 - 75.615.5 %   Platelets 164  150 - 400 K/uL   Neutrophils Relative % 76  43 - 77 %   Neutro Abs 7.7  1.7 - 7.7 K/uL   Lymphocytes Relative 16  12 - 46 %   Lymphs Abs 1.6  0.7 - 4.0 K/uL   Monocytes Relative 7  3 - 12 %   Monocytes Absolute 0.7  0.1 - 1.0 K/uL   Eosinophils Relative 1  0 - 5 %   Eosinophils  Absolute 0.1  0.0 - 0.7 K/uL   Basophils Relative 0  0 - 1 %   Basophils Absolute 0.0  0.0 - 0.1 K/uL  COMPREHENSIVE METABOLIC PANEL   Collection Time    06/12/13  2:15 PM      Result Value Range   Sodium 138  137 - 147 mEq/L   Potassium 4.2  3.7 - 5.3 mEq/L   Chloride 103  96 - 112 mEq/L   CO2 24  19 - 32 mEq/L   Glucose, Bld 83  70 - 99 mg/dL   BUN 7  6 - 23 mg/dL   Creatinine, Ser 4.330.50  0.50 - 1.10 mg/dL   Calcium 9.2  8.4 - 29.510.5 mg/dL   Total Protein 7.1  6.0 - 8.3 g/dL   Albumin 3.4 (*) 3.5 - 5.2 g/dL   AST 11  0 - 37 U/L   ALT 6  0 - 35 U/L   Alkaline Phosphatase 47  39 - 117 U/L   Total Bilirubin 0.4  0.3 - 1.2 mg/dL   GFR calc non Af Amer >90  >90 mL/min   GFR calc Af Amer >90  >90 mL/min  WET PREP, GENITAL   Collection Time    06/12/13  2:45 PM      Result Value Range   Yeast Wet Prep HPF POC NONE SEEN  NONE SEEN   Trich, Wet Prep NONE SEEN  NONE SEEN   Clue Cells Wet Prep HPF POC FEW (*) NONE SEEN   WBC, Wet Prep HPF POC FEW (*) NONE SEEN   Imaging Studies:  2/1 Limited US No evidence of previa or abruption on US.  [place] Ob Comp + 14 Wk  06/02/2013   OBSTETRICAL ULTRASOUND: This exam was performed within a [facility]. The OB US report was generated in the [facility] system, and faxed to the ordering physician.   This report is also available in [facility]'s [employer] and in the [facility] [employer] PACS. See [facility] Obstetric US report.  [place] Renal  06/04/2013   CLINICAL DATA:  Right lower quadrant abdominal pain, [redacted] weeks pregnant.  EXAM: RENAL/URINARY TRACT ULTRASOUND COMPLETE  COMPARISON:  None.  FINDINGS: Right Kidney:  Length: 11.3 cm. Echogenicity within normal limits. Mild hydronephrosis visualized. No mass.  Left Kidney:  Length: 10.2 cm. Echogenicity within  normal limits. No mass or hydronephrosis visualized.  Bladder:  Appears normal for degree of bladder distention.  IMPRESSION: 1. Mild right hydronephrosis.   Electronically Signed   By: Signa Kell  M.D.   On: 06/04/2013 01:55    Assessment: Katherine Conrad is  23 y.o. G2P1001 at [redacted]w[redacted]d presents with vaginal bleeding most likely related to coital trauma given bleeding immediately following intercourse. Strongly doubt abruption or previa. Stable cervix. Return precautions reviewed. Recommend at least 1 week pelvic rest and f/u W/ Primary OB as scheduled.  Nephrolithiasis: Agree with prior evaluation. Does not seem to have passed yet. Will give patient refill on percocet #20  +Clue cells: will tx for BV with flagyl 500 bid x7d  Katherine Conrad RYAN 2/1/20155:48 PM

## 2013-06-12 NOTE — MAU Note (Signed)
Pt states pain is 8/10, noted scant amt of blood on pad

## 2013-06-12 NOTE — ED Provider Notes (Signed)
  I performed a history and physical examination of Katherine Conrad and discussed her management with Ms. Neese.  I agree with the history, physical, assessment, and plan of care, with the following exceptions: None  I was present for the following procedures: None Time Spent in Critical Care of the patient: None Time spent in discussions with the patient and family: 377  22 y.o. Female at 5321 w 1 day gestation who presents complaining of some abdominal cramping and vaginal bleeding. [facility].   [name],[name]    [name], MD 06/12/13 [phone]

## 2013-06-13 LAB — GC/CHLAMYDIA PROBE AMP
CT PROBE, AMP APTIMA: NEGATIVE
GC Probe RNA: NEGATIVE

## 2013-06-15 ENCOUNTER — Ambulatory Visit (INDEPENDENT_AMBULATORY_CARE_PROVIDER_SITE_OTHER): Payer: Medicaid Other | Admitting: Family Medicine

## 2013-06-15 ENCOUNTER — Encounter: Payer: Self-pay | Admitting: Family Medicine

## 2013-06-15 ENCOUNTER — Other Ambulatory Visit (HOSPITAL_COMMUNITY)
Admission: RE | Admit: 2013-06-15 | Discharge: 2013-06-15 | Disposition: A | Discharge status: Home / Self Care | Payer: Medicaid Other | Source: Ambulatory Visit | Attending: Family Medicine | Admitting: Family Medicine

## 2013-06-15 VITALS — BP 116/67 | Temp 97.6°F | Wt 150.8 lb

## 2013-06-15 DIAGNOSIS — N200 Calculus of kidney: Secondary | ICD-10-CM | POA: Insufficient documentation

## 2013-06-15 DIAGNOSIS — Z01419 Encounter for gynecological examination (general) (routine) without abnormal findings: Secondary | ICD-10-CM | POA: Insufficient documentation

## 2013-06-15 DIAGNOSIS — O0990 Supervision of high risk pregnancy, unspecified, unspecified trimester: Secondary | ICD-10-CM

## 2013-06-15 DIAGNOSIS — O26839 Pregnancy related renal disease, unspecified trimester: Secondary | ICD-10-CM

## 2013-06-15 LAB — POCT URINALYSIS DIP (DEVICE)
Bilirubin Urine: NEGATIVE
GLUCOSE, UA: NEGATIVE mg/dL
Hgb urine dipstick: NEGATIVE
KETONES UR: NEGATIVE mg/dL
Leukocytes, UA: NEGATIVE
Nitrite: NEGATIVE
Protein, ur: NEGATIVE mg/dL
Specific Gravity, Urine: 1.025 (ref 1.005–1.030)
UROBILINOGEN UA: 0.2 mg/dL (ref 0.0–1.0)
pH: 6.5 (ref 5.0–8.0)

## 2013-06-15 NOTE — Progress Notes (Signed)
P=76 Pt had bleeding on Sunday visit to MAU . Reports pain right flank.

## 2013-06-15 NOTE — Progress Notes (Signed)
23 yo G2P1001 @ 5538w1d here for ROBV  - Has had a couple episodes of bleeding. Now resolved. No previa or abruption.  - has flank pain that is persistent from nephrolithiasis  - no ctx, lof. +FM  O: see flowsheet  A/P - PAP done today as was deferred at initial OB - will repeat UDS today given + for benzos last and now being given opiates for right flank pain and likely nephrolithiasis - VB precautions discussed.   - f/u in 4 weeks.

## 2013-06-15 NOTE — Patient Instructions (Signed)
Second Trimester of Pregnancy The second trimester is from week 13 through week 28, months 4 through 6. The second trimester is often a time when you feel your best. Your body has also adjusted to being pregnant, and you begin to feel better physically. Usually, morning sickness has lessened or quit completely, you may have more energy, and you may have an increase in appetite. The second trimester is also a time when the fetus is growing rapidly. At the end of the sixth month, the fetus is about 9 inches long and weighs about 1 pounds. You will likely begin to feel the baby move (quickening) between 18 and 20 weeks of the pregnancy. BODY CHANGES Your body goes through many changes during pregnancy. The changes vary from woman to woman.   Your weight will continue to increase. You will notice your lower abdomen bulging out.  You may begin to get stretch marks on your hips, abdomen, and breasts.  You may develop headaches that can be relieved by medicines approved by your caregiver.  You may urinate more often because the fetus is pressing on your bladder.  You may develop or continue to have heartburn as a result of your pregnancy.  You may develop constipation because certain hormones are causing the muscles that push waste through your intestines to slow down.  You may develop hemorrhoids or swollen, bulging veins (varicose veins).  You may have back pain because of the weight gain and pregnancy hormones relaxing your joints between the bones in your pelvis and as a result of a shift in weight and the muscles that support your balance.  Your breasts will continue to grow and be tender.  Your gums may bleed and may be sensitive to brushing and flossing.  Dark spots or blotches (chloasma, mask of pregnancy) may develop on your face. This will likely fade after the baby is born.  A dark line from your belly button to the pubic area (linea nigra) may appear. This will likely fade after the  baby is born. WHAT TO EXPECT AT YOUR PRENATAL VISITS During a routine prenatal visit:  You will be weighed to make sure you and the fetus are growing normally.  Your blood pressure will be taken.  Your abdomen will be measured to track your baby's growth.  The fetal heartbeat will be listened to.  Any test results from the previous visit will be discussed. Your caregiver may ask you:  How you are feeling.  If you are feeling the baby move.  If you have had any abnormal symptoms, such as leaking fluid, bleeding, severe headaches, or abdominal cramping.  If you have any questions. Other tests that may be performed during your second trimester include:  Blood tests that check for:  Low iron levels (anemia).  Gestational diabetes (between 24 and 28 weeks).  Rh antibodies.  Urine tests to check for infections, diabetes, or protein in the urine.  An ultrasound to confirm the proper growth and development of the baby.  An amniocentesis to check for possible genetic problems.  Fetal screens for spina bifida and Down syndrome. HOME CARE INSTRUCTIONS   Avoid all smoking, herbs, alcohol, and unprescribed drugs. These chemicals affect the formation and growth of the baby.  Follow your caregiver's instructions regarding medicine use. There are medicines that are either safe or unsafe to take during pregnancy.  Exercise only as directed by your caregiver. Experiencing uterine cramps is a good sign to stop exercising.  Continue to eat regular,   healthy meals.  Wear a good support bra for breast tenderness.  Do not use hot tubs, steam rooms, or saunas.  Wear your seat belt at all times when driving.  Avoid raw meat, uncooked cheese, cat litter boxes, and soil used by cats. These carry germs that can cause birth defects in the baby.  Take your prenatal vitamins.  Try taking a stool softener (if your caregiver approves) if you develop constipation. Eat more high-fiber foods,  such as fresh vegetables or fruit and whole grains. Drink plenty of fluids to keep your urine clear or pale yellow.  Take warm sitz baths to soothe any pain or discomfort caused by hemorrhoids. Use hemorrhoid cream if your caregiver approves.  If you develop varicose veins, wear support hose. Elevate your feet for 15 minutes, 3 4 times a day. Limit salt in your diet.  Avoid heavy lifting, wear low heel shoes, and practice good posture.  Rest with your legs elevated if you have leg cramps or low back pain.  Visit your dentist if you have not gone yet during your pregnancy. Use a soft toothbrush to brush your teeth and be gentle when you floss.  A sexual relationship may be continued unless your caregiver directs you otherwise.  Continue to go to all your prenatal visits as directed by your caregiver. SEEK MEDICAL CARE IF:   You have dizziness.  You have mild pelvic cramps, pelvic pressure, or nagging pain in the abdominal area.  You have persistent nausea, vomiting, or diarrhea.  You have a bad smelling vaginal discharge.  You have pain with urination. SEEK IMMEDIATE MEDICAL CARE IF:   You have a fever.  You are leaking fluid from your vagina.  You have spotting or bleeding from your vagina.  You have severe abdominal cramping or pain.  You have rapid weight gain or loss.  You have shortness of breath with chest pain.  You notice sudden or extreme swelling of your face, hands, ankles, feet, or legs.  You have not felt your baby move in over an hour.  You have severe headaches that do not go away with medicine.  You have vision changes. Document Released: 04/22/2001 Document Revised: 12/29/2012 Document Reviewed: 06/29/2012 [employer] Patient Information 2014 [employer].  

## 2013-06-17 LAB — BENZODIAZEPINES (GC/LC/MS), URINE
ALPRAZOLAMU: NEGATIVE ng/mL
Alprazolam (GC/LC/MS), ur confirm: NEGATIVE ng/mL
Clonazepam metabolite (GC/LC/MS), ur confirm: NEGATIVE ng/mL
DIAZEPAMUC: NEGATIVE ng/mL
Estazolam (GC/LC/MS), ur confirm: NEGATIVE ng/mL
Flunitrazepam metabolite (GC/LC/MS), ur confirm: NEGATIVE ng/mL
Flurazepam metabolite (GC/LC/MS), ur confirm: NEGATIVE ng/mL
Lorazepam (GC/LC/MS), ur confirm: NEGATIVE ng/mL
Midazolam (GC/LC/MS), ur confirm: NEGATIVE ng/mL
Nordiazepam (GC/LC/MS), ur confirm: NEGATIVE ng/mL
OXAZEPAMU: NEGATIVE ng/mL
Temazepam (GC/LC/MS), ur confirm: NEGATIVE ng/mL
Triazolam metabolite (GC/LC/MS), ur confirm: NEGATIVE ng/mL

## 2013-06-17 LAB — PRESCRIPTION MONITORING PROFILE (19 PANEL)
AMPHETAMINE/METH: NEGATIVE ng/mL
BARBITURATE SCREEN, URINE: NEGATIVE ng/mL
Buprenorphine, Urine: NEGATIVE ng/mL
Carisoprodol, Urine: NEGATIVE ng/mL
Cocaine Metabolites: NEGATIVE ng/mL
Creatinine, Urine: 124.23 mg/dL (ref >20.0)
FENTANYL URINE: NEGATIVE ng/mL
MDMA URINE: NEGATIVE ng/mL
Meperidine, Ur: NEGATIVE ng/mL
Methadone Screen, Urine: NEGATIVE ng/mL
Methaqualone: NEGATIVE ng/mL
Nitrites, Initial: NEGATIVE ug/mL
PH URINE, INITIAL: 6.6 pH (ref 4.5–8.9)
PROPOXYPHENE: NEGATIVE ng/mL
Phencyclidine, Ur: NEGATIVE ng/mL
TAPENTADOLUR: NEGATIVE ng/mL
TRAMADOL UR: NEGATIVE ng/mL
Zolpidem, Urine: NEGATIVE ng/mL

## 2013-06-17 LAB — OPIATES/OPIOIDS (LC/MS-MS)
Codeine Urine: NEGATIVE ng/mL
HEROIN (6-AM), UR: NEGATIVE ng/mL
Hydrocodone: NEGATIVE ng/mL
Hydromorphone: NEGATIVE ng/mL
Morphine Urine: NEGATIVE ng/mL
NORHYDROCODONE, UR: NEGATIVE ng/mL
NOROXYCODONE, UR: 1468 ng/mL — AB
Oxycodone, ur: 263 ng/mL — AB
Oxymorphone: 597 ng/mL — AB

## 2013-06-17 LAB — OXYCODONE, URINE (LC/MS-MS)
Noroxycodone, Ur: 1468 ng/mL — AB
OXYMORPHONE, URINE: 597 ng/mL — AB
Oxycodone, ur: 263 ng/mL — AB

## 2013-06-17 LAB — CANNABANOIDS (GC/LC/MS), URINE: THC-COOH UR CONFIRM: 28 ng/mL — AB

## 2013-06-28 ENCOUNTER — Emergency Department (HOSPITAL_COMMUNITY): Payer: Medicaid Other

## 2013-06-28 ENCOUNTER — Encounter (HOSPITAL_BASED_OUTPATIENT_CLINIC_OR_DEPARTMENT_OTHER): Payer: Self-pay | Admitting: Emergency Medicine

## 2013-06-28 ENCOUNTER — Emergency Department (HOSPITAL_COMMUNITY)
Admission: EM | Admit: 2013-06-28 | Discharge: 2013-06-28 | Disposition: A | Discharge status: Home / Self Care | Payer: Medicaid Other | Attending: Emergency Medicine | Admitting: Emergency Medicine

## 2013-06-28 ENCOUNTER — Telehealth: Payer: Self-pay | Admitting: *Deleted

## 2013-06-28 ENCOUNTER — Encounter (HOSPITAL_COMMUNITY): Payer: Self-pay | Admitting: Emergency Medicine

## 2013-06-28 ENCOUNTER — Emergency Department (HOSPITAL_BASED_OUTPATIENT_CLINIC_OR_DEPARTMENT_OTHER)
Admission: EM | Admit: 2013-06-28 | Discharge: 2013-06-28 | Disposition: A | Discharge status: Home / Self Care | Payer: Medicaid Other | Attending: Emergency Medicine | Admitting: Emergency Medicine

## 2013-06-28 DIAGNOSIS — S0990XA Unspecified injury of head, initial encounter: Secondary | ICD-10-CM | POA: Insufficient documentation

## 2013-06-28 DIAGNOSIS — W010XXA Fall on same level from slipping, tripping and stumbling without subsequent striking against object, initial encounter: Secondary | ICD-10-CM | POA: Insufficient documentation

## 2013-06-28 DIAGNOSIS — M549 Dorsalgia, unspecified: Secondary | ICD-10-CM

## 2013-06-28 DIAGNOSIS — Z87442 Personal history of urinary calculi: Secondary | ICD-10-CM | POA: Insufficient documentation

## 2013-06-28 DIAGNOSIS — Z8701 Personal history of pneumonia (recurrent): Secondary | ICD-10-CM | POA: Insufficient documentation

## 2013-06-28 DIAGNOSIS — Z792 Long term (current) use of antibiotics: Secondary | ICD-10-CM | POA: Insufficient documentation

## 2013-06-28 DIAGNOSIS — O99891 Other specified diseases and conditions complicating pregnancy: Secondary | ICD-10-CM | POA: Insufficient documentation

## 2013-06-28 DIAGNOSIS — Z8709 Personal history of other diseases of the respiratory system: Secondary | ICD-10-CM | POA: Insufficient documentation

## 2013-06-28 DIAGNOSIS — Y9301 Activity, walking, marching and hiking: Secondary | ICD-10-CM | POA: Insufficient documentation

## 2013-06-28 DIAGNOSIS — W19XXXA Unspecified fall, initial encounter: Secondary | ICD-10-CM

## 2013-06-28 DIAGNOSIS — IMO0002 Reserved for concepts with insufficient information to code with codable children: Secondary | ICD-10-CM | POA: Insufficient documentation

## 2013-06-28 DIAGNOSIS — O9989 Other specified diseases and conditions complicating pregnancy, childbirth and the puerperium: Principal | ICD-10-CM

## 2013-06-28 DIAGNOSIS — Y939 Activity, unspecified: Secondary | ICD-10-CM | POA: Insufficient documentation

## 2013-06-28 DIAGNOSIS — Z79899 Other long term (current) drug therapy: Secondary | ICD-10-CM | POA: Insufficient documentation

## 2013-06-28 DIAGNOSIS — Y9289 Other specified places as the place of occurrence of the external cause: Secondary | ICD-10-CM | POA: Insufficient documentation

## 2013-06-28 DIAGNOSIS — Y929 Unspecified place or not applicable: Secondary | ICD-10-CM | POA: Insufficient documentation

## 2013-06-28 DIAGNOSIS — N189 Chronic kidney disease, unspecified: Secondary | ICD-10-CM | POA: Insufficient documentation

## 2013-06-28 HISTORY — DX: Calculus of kidney: N20.0

## 2013-06-28 LAB — URINALYSIS, ROUTINE W REFLEX MICROSCOPIC
BILIRUBIN URINE: NEGATIVE
Glucose, UA: NEGATIVE mg/dL
Hgb urine dipstick: NEGATIVE
KETONES UR: NEGATIVE mg/dL
Leukocytes, UA: NEGATIVE
Nitrite: NEGATIVE
PROTEIN: NEGATIVE mg/dL
Specific Gravity, Urine: 1.025 (ref 1.005–1.030)
Urobilinogen, UA: 0.2 mg/dL (ref 0.0–1.0)
pH: 7.5 (ref 5.0–8.0)

## 2013-06-28 MED ORDER — ACETAMINOPHEN 325 MG PO TABS: 650.0000 mg | ORAL_TABLET | Freq: Once | ORAL | Status: DC

## 2013-06-28 MED ORDER — ACETAMINOPHEN 325 MG PO TABS
650.0000 mg | ORAL_TABLET | Freq: Once | ORAL | Status: AC
  Administered 2013-06-28: 650 mg via ORAL
  Filled 2013-06-28: qty 2

## 2013-06-28 NOTE — ED Provider Notes (Signed)
CSN: [id number]     Arrival date & time 06/28/13  0032 History   First MD Initiated Contact with Patient 06/28/13 0034     Chief Complaint  Patient presents with  . Fall     (Consider location/radiation/quality/duration/timing/severity/associated sxs/prior Treatment) Patient is a 23 y.o. female presenting with fall. The history is provided by the patient.  Fall   patient here after a slip and fall while walking down the stairs. She lost consciousness for an unknown period time. No vomiting after the incident. Denies any neck pain. Does note some mid thoracic and lumbar paraspinal pain. Back pain characterized as sharp and worse with movement. Does also note mild occipital headache as well as a hematoma. She denies any vaginal bleeding. She has felt normal fetal movement. No abdominal pain. No weakness in arms or legs. EMS was called and patient transported here.  Past Medical History  Diagnosis Date  . Pneumonia   . Chronic kidney disease     Kidney stones  . Flu    Past Surgical History  Procedure Laterality Date  . Tonsillectomy    . Laparoscopic abdominal exploration     No family history on file. History  Substance Use Topics  . Smoking status: Never Smoker   . Smokeless tobacco: Never Used  . Alcohol Use: No   OB History   Grav Para Term Preterm Abortions TAB SAB Ect Mult Living   2 1 1       1      Review of Systems  All other systems reviewed and are negative.      Allergies  Review of patient's allergies indicates no known allergies.  Home Medications   Current Outpatient Rx  Name  Route  Sig  Dispense  Refill  . metroNIDAZOLE (FLAGYL) 500 MG tablet   Oral   Take 1 tablet (500 mg total) by mouth 2 (two) times daily.   14 tablet   0   . oxyCODONE-acetaminophen (PERCOCET/ROXICET) 5-325 MG per tablet   Oral   Take 2 tablets by mouth every 4 (four) hours as needed for severe pain.   20 tablet   0   . Prenatal Vit-Fe Fumarate-FA (PRENATAL  MULTIVITAMIN) TABS tablet   Oral   Take 1 tablet by mouth daily at 12 noon.          BP 110/70  Pulse 100  Resp 16  Ht 5\' 6"  (1.676 m)  Wt 145 lb (65.772 kg)  BMI 23.41 kg/m2  SpO2 100% Physical Exam  Nursing note and vitals reviewed. Constitutional: She is oriented to person, place, and time. She appears well-developed and well-nourished.  Non-toxic appearance. No distress.  HENT:  Head: Normocephalic and atraumatic.    Eyes: Conjunctivae, EOM and lids are normal. Pupils are equal, round, and reactive to light.  Neck: Normal range of motion. Neck supple. No spinous process tenderness and no muscular tenderness present. No tracheal deviation present. No mass present.  Cardiovascular: Normal rate, regular rhythm and normal heart sounds.  Exam reveals no gallop.   No murmur heard. Pulmonary/Chest: Effort normal and breath sounds normal. No stridor. No respiratory distress. She has no decreased breath sounds. She has no wheezes. She has no rhonchi. She has no rales.  Abdominal: Soft. Normal appearance and bowel sounds are normal. She exhibits no distension. There is no tenderness. There is no rebound and no CVA tenderness.  Musculoskeletal: Normal range of motion. She exhibits no edema and no tenderness.  Back:  Neurological: She is alert and oriented to person, place, and time. She has normal strength. No cranial nerve deficit or sensory deficit. GCS eye subscore is 4. GCS verbal subscore is 5. GCS motor subscore is 6.  Skin: Skin is warm and dry. No abrasion and no rash noted.  Psychiatric: She has a normal mood and affect. Her speech is normal and behavior is normal.    ED Course  Procedures (including critical care time) Labs Review Labs Reviewed - No data to display Imaging Review Ct Head Wo Contrast  06/28/2013   CLINICAL DATA:  Slipped, fall.  Hit head.  EXAM: CT HEAD WITHOUT CONTRAST  TECHNIQUE: Contiguous axial images were obtained from the base of the skull  through the vertex without intravenous contrast.  COMPARISON:  CT HEAD W/O CM dated 01/09/2013  FINDINGS: No acute intracranial abnormality. Specifically, no hemorrhage, hydrocephalus, mass lesion, acute infarction, or significant intracranial injury. No acute calvarial abnormality.  Mucosal thickening within the ethmoid air cells and frontal sinuses. No air-fluid levels. Mastoid air cells are clear.  IMPRESSION: No intracranial abnormality.  Chronic sinusitis changes.   Electronically Signed   By: Charlett Nose M.D.   On: 06/28/2013 01:10    EKG Interpretation   None       MDM   Final diagnoses:  None    Patient given Tylenol for her headache. Head CT was negative. Patient requesting Percocet and I've informed her that she needs to get this from her regular doctor.    Toy Baker, MD 06/28/13 [phone]

## 2013-06-28 NOTE — ED Notes (Signed)
Pt fell outside on the ice/snow and had positive LOC.  Husband reported to EMS that she is just not acting like herself.  Points to posterior head and back pain when asked where pain is. -

## 2013-06-28 NOTE — Discharge Instructions (Signed)

## 2013-06-28 NOTE — ED Provider Notes (Signed)
CSN: [id number]     Arrival date & time 06/28/13  1623 History  This chart was scribed for Hilario Quarry, MD by Carl Best, ED Scribe. This patient was seen in room MH08/MH08 and the patient's care was started at 6:17 PM.     Chief Complaint  Patient presents with  . Fall  . Back Pain      Patient is a 23 y.o. female presenting with fall and back pain. The history is provided by the patient. No language interpreter was used.  Fall Pertinent negatives include no headaches.  Back Pain Associated symptoms: no headaches    HPI Comments: Illianna Paschal is a 23 y.o. female who presents to the Emergency Department complaining constant back pain that started last night after the patient slipped and fell on ice.  She states that she landed flat on her back and hit her head.  She states that she lost consciousness for 45 minutes and was taken to [facility].  She states that she received a CT scan of her head at [facility] which revealed normal results and was given two Tylenol in the ED.  She states that she was unable to sleep last night and is complaining of constant back pain and headache located in the back of her head.  She states that she feels as though her back is bruised.  She states that she is [redacted] weeks pregnant and was told to report to [facility] by the [facility] to be reevaluated.  She states that she can still feel the baby moving since the incident. She states that she had an ultrasound performed at [facility] last week which revealed small kidney stones.   The patient states that this is her second pregnancy.  She denies taking any medication currently.   Past Medical History  Diagnosis Date  . Pneumonia   . Chronic kidney disease     Kidney stones  . Flu   . Renal calculi    Past Surgical History  Procedure Laterality Date  . Tonsillectomy    . Laparoscopic abdominal exploration     No family history on file. History  Substance Use Topics  . Smoking status: Never Smoker    . Smokeless tobacco: Never Used  . Alcohol Use: No   OB History   Grav Para Term Preterm Abortions TAB SAB Ect Mult Living   2 1 1       1      Review of Systems  Musculoskeletal: Positive for back pain.  Skin: Negative for color change.  Neurological: Negative for headaches.  All other systems reviewed and are negative.      Allergies  Review of patient's allergies indicates no known allergies.  Home Medications   Current Outpatient Rx  Name  Route  Sig  Dispense  Refill  . metroNIDAZOLE (FLAGYL) 500 MG tablet   Oral   Take 1 tablet (500 mg total) by mouth 2 (two) times daily.   14 tablet   0   . oxyCODONE-acetaminophen (PERCOCET/ROXICET) 5-325 MG per tablet   Oral   Take 2 tablets by mouth every 4 (four) hours as needed for severe pain.   20 tablet   0   . Prenatal Vit-Fe Fumarate-FA (PRENATAL MULTIVITAMIN) TABS tablet   Oral   Take 1 tablet by mouth daily at 12 noon.          Triage Vitals: BP 106/60  Pulse 86  Temp(Src) 98.6 F (37 C) (Oral)  Resp 20  Ht 5\' 7"  (1.702 m)  Wt 145 lb (65.772 kg)  BMI 22.71 kg/m2  SpO2 100%  Physical Exam  Nursing note and vitals reviewed. Constitutional: She is oriented to person, place, and time. She appears well-developed and well-nourished. No distress.  HENT:  Head: Normocephalic and atraumatic. Head is without contusion.  Mouth/Throat: Uvula is midline.  Eyes: EOM are normal.  Neck: Neck supple. No tracheal deviation present.  Cardiovascular: Normal rate.   Pulmonary/Chest: Effort normal. No respiratory distress.  Musculoskeletal: Normal range of motion.   No point tenderness of the back   Neurological: She is alert and oriented to person, place, and time. She has normal strength. She displays normal reflexes. No cranial nerve deficit. She exhibits normal muscle tone. Coordination and gait normal.  No neurological deficit  Skin: Skin is warm and dry.  No ecchymosis on the back.   Psychiatric: She has a  normal mood and affect. Her behavior is normal.    ED Course  Procedures (including critical care time)  DIAGNOSTIC STUDIES: Oxygen Saturation is 100% on room air, normal by my interpretation.    COORDINATION OF CARE: 6:22 PM- discussed obtaining a urine sample to check for hematuria.  Advised the patient to continue taking Tylenol and apply heat and ice to her back to treat her symptoms.  The patient agreed to the treatment plan.    Labs Review Labs Reviewed - No data to display Imaging Review Ct Head Wo Contrast  06/28/2013   CLINICAL DATA:  Slipped, fall.  Hit head.  EXAM: CT HEAD WITHOUT CONTRAST  TECHNIQUE: Contiguous axial images were obtained from the base of the skull through the vertex without intravenous contrast.  COMPARISON:  CT HEAD W/O CM dated 01/09/2013  FINDINGS: No acute intracranial abnormality. Specifically, no hemorrhage, hydrocephalus, mass lesion, acute infarction, or significant intracranial injury. No acute calvarial abnormality.  Mucosal thickening within the ethmoid air cells and frontal sinuses. No air-fluid levels. Mastoid air cells are clear.  IMPRESSION: No intracranial abnormality.  Chronic sinusitis changes.   Electronically Signed   By: Charlett NoseKevin  Dover M.D.   On: 06/28/2013 01:10    EKG Interpretation   None       MDM   23 year old 9824 week pregnant female who comes in today after fall last night and striking her head. She had a CT done last [facility] at [facility]. She presents today stating that her back continues to hurt. She states it feels like it is bruised all over. She has no point tenderness on her exam. I do not think it is likely that she has fractured any vertebrae. I did obtain a urine and it does  not show any hematuria.  The patient is advised to use acetaminophen for pain and cold therapy.   Hilario Quarryanielle S Analysse Quinonez, MD 06/28/13 [phone]

## 2013-06-28 NOTE — ED Notes (Signed)
Pt calls this rn into her room, pt noted with slurred speech, states "I need some pain medicine..." pt informed that md will be in to see her and decide what pain medicine, if any, she will receive.  Pt verbalizes understanding.

## 2013-06-28 NOTE — ED Notes (Signed)
Patient transported to CT 

## 2013-06-28 NOTE — Telephone Encounter (Addendum)
Pt left message stating that she slipped and fell yesterday, was seen at the ED and has a mild concussion. She is requesting pain medication for a headache to be sent to her pharmacy.  I called pt back after consult with Dr. Elsie LincolnKelly Leggett [facility](Ob attending on-call).  Pt stated that she is having back pain and headache today and would like Rx for pain medication.  I informed pt that she will need to return to [facility]-ED or Urgent Care for evaluation and pain management (per Dr. Penne LashLeggett).  Pt voiced understanding.  **Also in my discussion with Dr. Penne LashLeggett, she stated that pt may be prescribed Percocet for pain by ED physician if indicated clinically.

## 2013-06-28 NOTE — Discharge Instructions (Signed)
RICE: Routine Care for Injuries °The routine care of many injuries includes Rest, Ice, Compression, and Elevation (RICE). °HOME CARE INSTRUCTIONS °· Rest is needed to allow your body to heal. Routine activities can usually be resumed when comfortable. Injured tendons and bones can take up to 6 weeks to heal. Tendons are the cord-like structures that attach muscle to bone. °· Ice following an injury helps keep the swelling down and reduces pain. °· Put ice in a plastic bag. °· Place a towel between your skin and the bag. °· Leave the ice on for 15-20 minutes, 03-04 times a day. Do this while awake, for the first 24 to 48 hours. After that, continue as directed by your caregiver. °· Compression helps keep swelling down. It also gives support and helps with discomfort. If an elastic bandage has been applied, it should be removed and reapplied every 3 to 4 hours. It should not be applied tightly, but firmly enough to keep swelling down. Watch fingers or toes for swelling, bluish discoloration, coldness, numbness, or excessive pain. If any of these problems occur, remove the bandage and reapply loosely. Contact your caregiver if these problems continue. °· Elevation helps reduce swelling and decreases pain. With extremities, such as the arms, hands, legs, and feet, the injured area should be placed near or above the level of the heart, if possible. °SEEK IMMEDIATE MEDICAL CARE IF: °· You have persistent pain and swelling. °· You develop redness, numbness, or unexpected weakness. °· Your symptoms are getting worse rather than improving after several days. °These symptoms may indicate that further evaluation or further X-rays are needed. Sometimes, X-rays may not show a small broken bone (fracture) until 1 week or 10 days later. Make a follow-up appointment with your caregiver. Ask when your X-Dazaria Macneill results will be ready. Make sure you get your X-Duvid Smalls results. °Document Released: 08/10/2000 Document Revised: 07/21/2011  Document Reviewed: 09/27/2010 °ExitCare® Patient Information ©2014 [employer]. °Muscle Strain °A muscle strain is an injury that occurs when a muscle is stretched beyond its normal length. Usually a small number of muscle fibers are torn when this happens. Muscle strain is rated in degrees. First-degree strains have the least amount of muscle fiber tearing and pain. Second-degree and third-degree strains have increasingly more tearing and pain.  °Usually, recovery from muscle strain takes 1 2 weeks. Complete healing takes 5 6 weeks.  °CAUSES  °Muscle strain happens when a sudden, violent force placed on a muscle stretches it too far. This may occur with lifting, sports, or a fall.  °RISK FACTORS °Muscle strain is especially common in athletes.  °SIGNS AND SYMPTOMS °At the site of the muscle strain, there may be: °· Pain. °· Bruising. °· Swelling. °· Difficulty using the muscle due to pain or lack of normal function. °DIAGNOSIS  °Your health care provider will perform a physical exam and ask about your medical history. °TREATMENT  °Often, the best treatment for a muscle strain is resting, icing, and applying cold compresses to the injured area.   °HOME CARE INSTRUCTIONS  °· Use the PRICE method of treatment to promote muscle healing during the first 2 3 days after your injury. The PRICE method involves: °· Protecting the muscle from being injured again. °· Restricting your activity and resting the injured body part. °· Icing your injury. To do this, put ice in a plastic bag. Place a towel between your skin and the bag. Then, apply the ice and leave it on from 15 20 minutes each hour. After   the third day, switch to moist heat packs. °· Apply compression to the injured area with a splint or elastic bandage. Be careful not to wrap it too tightly. This may interfere with blood circulation or increase swelling. °· Elevate the injured body part above the level of your heart as often as you can. °· Only take  over-the-counter or prescription medicines for pain, discomfort, or fever as directed by your health care provider. °· Warming up prior to exercise helps to prevent future muscle strains. °SEEK MEDICAL CARE IF:  °· You have increasing pain or swelling in the injured area. °· You have numbness, tingling, or a significant loss of strength in the injured area. °MAKE SURE YOU:  °· Understand these instructions. °· Will watch your condition. °· Will get help right away if you are not doing well or get worse. °Document Released: 04/28/2005 Document Revised: 02/16/2013 Document Reviewed: 11/25/2012 °ExitCare® Patient Information ©2014 [employer]. ° °

## 2013-06-28 NOTE — ED Notes (Signed)
Last night she slipped on ice and fell onto her back. Loc x 45 minutes. [facility] was normal. She was given Tylenol. She wants stronger pain medication. She is [redacted] weeks pregnant.

## 2013-06-28 NOTE — ED Notes (Signed)
Returned from CT.

## 2013-07-04 ENCOUNTER — Telehealth: Payer: Self-pay

## 2013-07-04 NOTE — Telephone Encounter (Signed)
Pt. Called stating "I am 24 weeks pregnancy and producing milk, is this normal? What should I do about it?"

## 2013-07-04 NOTE — Telephone Encounter (Signed)
Attempted to call pt. No answer. Left message to call clinics.

## 2013-07-05 NOTE — Telephone Encounter (Signed)
Called pt. Pt. States she has started producing colostrom and wants to know if this is normal. Informed pt. That this is normal and that it can start as early as 12 weeks. Pt. Verbalized understanding and had no further questions or concerns.

## 2013-07-13 ENCOUNTER — Encounter: Payer: Medicaid Other | Admitting: Family Medicine

## 2013-08-02 ENCOUNTER — Ambulatory Visit (INDEPENDENT_AMBULATORY_CARE_PROVIDER_SITE_OTHER): Payer: Medicaid Other | Admitting: Advanced Practice Midwife

## 2013-08-02 VITALS — BP 106/66 | Temp 96.7°F | Wt 152.3 lb

## 2013-08-02 DIAGNOSIS — O0990 Supervision of high risk pregnancy, unspecified, unspecified trimester: Secondary | ICD-10-CM

## 2013-08-02 DIAGNOSIS — O26839 Pregnancy related renal disease, unspecified trimester: Secondary | ICD-10-CM

## 2013-08-02 LAB — POCT URINALYSIS DIP (DEVICE)
BILIRUBIN URINE: NEGATIVE
Glucose, UA: NEGATIVE mg/dL
Ketones, ur: NEGATIVE mg/dL
Leukocytes, UA: NEGATIVE
Nitrite: NEGATIVE
PH: 7 (ref 5.0–8.0)
Protein, ur: 30 mg/dL — AB
SPECIFIC GRAVITY, URINE: 1.02 (ref 1.005–1.030)
Urobilinogen, UA: 0.2 mg/dL (ref 0.0–1.0)

## 2013-08-02 LAB — CBC
HCT: 31.4 % — ABNORMAL LOW (ref 36.0–46.0)
Hemoglobin: 10.6 g/dL — ABNORMAL LOW (ref 12.0–15.0)
MCH: 30.6 pg (ref 26.0–34.0)
MCHC: 33.8 g/dL (ref 30.0–36.0)
MCV: 90.8 fL (ref 78.0–100.0)
Platelets: 199 10*3/uL (ref 150–400)
RBC: 3.46 MIL/uL — ABNORMAL LOW (ref 3.87–5.11)
RDW: 12.8 % (ref 11.5–15.5)
WBC: 8.5 10*3/uL (ref 4.0–10.5)

## 2013-08-02 MED ORDER — TETANUS-DIPHTH-ACELL PERTUSSIS 5-2.5-18.5 LF-MCG/0.5 IM SUSP: 0.5000 mL | Freq: Once | INTRAMUSCULAR | Status: DC

## 2013-08-02 MED ORDER — CYCLOBENZAPRINE HCL 10 MG PO TABS: 10.0000 mg | ORAL_TABLET | Freq: Three times a day (TID) | ORAL | Status: DC | PRN

## 2013-08-02 NOTE — Progress Notes (Signed)
Doing well.  Good fetal movement, denies vaginal bleeding, LOF, cramping/contractions.  Right side/flank/lower back pain, reports it resolved x2 weeks then returned.  Negative CVA tenderness, but tenderness to palpation of right lower back. Likely musculoskeletal pain.  Flexeril 5-10 mg TID PRN.

## 2013-08-02 NOTE — Progress Notes (Signed)
P= 87  Pt complains of pressure in lower back (right side)

## 2013-08-03 ENCOUNTER — Encounter: Payer: Self-pay | Admitting: Advanced Practice Midwife

## 2013-08-03 ENCOUNTER — Telehealth: Payer: Self-pay | Admitting: General Practice

## 2013-08-03 LAB — GLUCOSE TOLERANCE, 1 HOUR (50G) W/O FASTING: GLUCOSE 1 HOUR GTT: 83 mg/dL (ref 70–140)

## 2013-08-03 LAB — HIV ANTIBODY (ROUTINE TESTING W REFLEX): HIV: NONREACTIVE

## 2013-08-03 LAB — RPR

## 2013-08-03 NOTE — Telephone Encounter (Signed)
Patient called and left message stating she saw Misty StanleyLisa the midwife today and was told she was going to send in a Rx for flexeril [place] but it's not there and she would like a call back letting her know what needs to be done. Per chart review pharmacy did receive the Rx around 540pm. Called patient, no answer- left message stating we are returning her phone call and the prescription should be at her pharmacy now if it's not please call us back and let us know if you still need assistance

## 2013-08-17 ENCOUNTER — Ambulatory Visit (INDEPENDENT_AMBULATORY_CARE_PROVIDER_SITE_OTHER): Payer: Self-pay | Admitting: Obstetrics and Gynecology

## 2013-08-17 ENCOUNTER — Encounter: Payer: Self-pay | Admitting: Obstetrics and Gynecology

## 2013-08-17 VITALS — BP 103/62 | Temp 97.6°F | Wt 150.5 lb

## 2013-08-17 DIAGNOSIS — N200 Calculus of kidney: Secondary | ICD-10-CM

## 2013-08-17 DIAGNOSIS — O26839 Pregnancy related renal disease, unspecified trimester: Secondary | ICD-10-CM

## 2013-08-17 LAB — POCT URINALYSIS DIP (DEVICE)
Bilirubin Urine: NEGATIVE
GLUCOSE, UA: NEGATIVE mg/dL
Hgb urine dipstick: NEGATIVE
Ketones, ur: NEGATIVE mg/dL
LEUKOCYTES UA: NEGATIVE
NITRITE: NEGATIVE
PROTEIN: NEGATIVE mg/dL
Specific Gravity, Urine: 1.02 (ref 1.005–1.030)
UROBILINOGEN UA: 0.2 mg/dL (ref 0.0–1.0)
pH: 8 (ref 5.0–8.0)

## 2013-08-17 NOTE — Patient Instructions (Signed)
Third Trimester of Pregnancy  The third trimester is from week 29 through week 42, months 7 through 9. The third trimester is a time when the fetus is growing rapidly. At the end of the ninth month, the fetus is about 20 inches in length and weighs 6 10 pounds.   BODY CHANGES  Your body goes through many changes during pregnancy. The changes vary from woman to woman.    Your weight will continue to increase. You can expect to gain 25 35 pounds (11 16 kg) by the end of the pregnancy.   You may begin to get stretch marks on your hips, abdomen, and breasts.   You may urinate more often because the fetus is moving lower into your pelvis and pressing on your bladder.   You may develop or continue to have heartburn as a result of your pregnancy.   You may develop constipation because certain hormones are causing the muscles that push waste through your intestines to slow down.   You may develop hemorrhoids or swollen, bulging veins (varicose veins).   You may have pelvic pain because of the weight gain and pregnancy hormones relaxing your joints between the bones in your pelvis. Back aches may result from over exertion of the muscles supporting your posture.   Your breasts will continue to grow and be tender. A yellow discharge may leak from your breasts called colostrum.   Your belly button may stick out.   You may feel short of breath because of your expanding uterus.   You may notice the fetus "dropping," or moving lower in your abdomen.   You may have a bloody mucus discharge. This usually occurs a few days to a week before labor begins.   Your cervix becomes thin and soft (effaced) near your due date.  WHAT TO EXPECT AT YOUR PRENATAL EXAMS   You will have prenatal exams every 2 weeks until week 36. Then, you will have weekly prenatal exams. During a routine prenatal visit:   You will be weighed to make sure you and the fetus are growing normally.   Your blood pressure is taken.   Your abdomen will be  measured to track your baby's growth.   The fetal heartbeat will be listened to.   Any test results from the previous visit will be discussed.   You may have a cervical check near your due date to see if you have effaced.  At around 36 weeks, your caregiver will check your cervix. At the same time, your caregiver will also perform a test on the secretions of the vaginal tissue. This test is to determine if a type of bacteria, Group B streptococcus, is present. Your caregiver will explain this further.  Your caregiver may ask you:   What your birth plan is.   How you are feeling.   If you are feeling the baby move.   If you have had any abnormal symptoms, such as leaking fluid, bleeding, severe headaches, or abdominal cramping.   If you have any questions.  Other tests or screenings that may be performed during your third trimester include:   Blood tests that check for low iron levels (anemia).   Fetal testing to check the health, activity level, and growth of the fetus. Testing is done if you have certain medical conditions or if there are problems during the pregnancy.  FALSE LABOR  You may feel small, irregular contractions that eventually go away. These are called Braxton Hicks contractions, or   false labor. Contractions may last for hours, days, or even weeks before true labor sets in. If contractions come at regular intervals, intensify, or become painful, it is best to be seen by your caregiver.   SIGNS OF LABOR    Menstrual-like cramps.   Contractions that are 5 minutes apart or less.   Contractions that start on the top of the uterus and spread down to the lower abdomen and back.   A sense of increased pelvic pressure or back pain.   A watery or bloody mucus discharge that comes from the vagina.  If you have any of these signs before the 37th week of pregnancy, call your caregiver right away. You need to go to the hospital to get checked immediately.  HOME CARE INSTRUCTIONS    Avoid all  smoking, herbs, alcohol, and unprescribed drugs. These chemicals affect the formation and growth of the baby.   Follow your caregiver's instructions regarding medicine use. There are medicines that are either safe or unsafe to take during pregnancy.   Exercise only as directed by your caregiver. Experiencing uterine cramps is a good sign to stop exercising.   Continue to eat regular, healthy meals.   Wear a good support bra for breast tenderness.   Do not use hot tubs, steam rooms, or saunas.   Wear your seat belt at all times when driving.   Avoid raw meat, uncooked cheese, cat litter boxes, and soil used by cats. These carry germs that can cause birth defects in the baby.   Take your prenatal vitamins.   Try taking a stool softener (if your caregiver approves) if you develop constipation. Eat more high-fiber foods, such as fresh vegetables or fruit and whole grains. Drink plenty of fluids to keep your urine clear or pale yellow.   Take warm sitz baths to soothe any pain or discomfort caused by hemorrhoids. Use hemorrhoid cream if your caregiver approves.   If you develop varicose veins, wear support hose. Elevate your feet for 15 minutes, 3 4 times a day. Limit salt in your diet.   Avoid heavy lifting, wear low heal shoes, and practice good posture.   Rest a lot with your legs elevated if you have leg cramps or low back pain.   Visit your dentist if you have not gone during your pregnancy. Use a soft toothbrush to brush your teeth and be gentle when you floss.   A sexual relationship may be continued unless your caregiver directs you otherwise.   Do not travel far distances unless it is absolutely necessary and only with the approval of your caregiver.   Take prenatal classes to understand, practice, and ask questions about the labor and delivery.   Make a trial run to the hospital.   Pack your hospital bag.   Prepare the baby's nursery.   Continue to go to all your prenatal visits as directed  by your caregiver.  SEEK MEDICAL CARE IF:   You are unsure if you are in labor or if your water has broken.   You have dizziness.   You have mild pelvic cramps, pelvic pressure, or nagging pain in your abdominal area.   You have persistent nausea, vomiting, or diarrhea.   You have a bad smelling vaginal discharge.   You have pain with urination.  SEEK IMMEDIATE MEDICAL CARE IF:    You have a fever.   You are leaking fluid from your vagina.   You have spotting or bleeding from your vagina.     You have severe abdominal cramping or pain.   You have rapid weight loss or gain.   You have shortness of breath with chest pain.   You notice sudden or extreme swelling of your face, hands, ankles, feet, or legs.   You have not felt your baby move in over an hour.   You have severe headaches that do not go away with medicine.   You have vision changes.  Document Released: 04/22/2001 Document Revised: 12/29/2012 Document Reviewed: 06/29/2012  Exit[employer] Patient Information 2014 [employer].

## 2013-08-17 NOTE — Progress Notes (Signed)
Doing well. Reviewed nl 28 lab results. Urine dip pending. Discussed healthy diet. Works as [employer] nurse.

## 2013-08-17 NOTE — Progress Notes (Signed)
Pulse: 77

## 2013-08-25 ENCOUNTER — Inpatient Hospital Stay (HOSPITAL_COMMUNITY)
Admission: AD | Admit: 2013-08-25 | Discharge: 2013-08-25 | Disposition: A | Discharge status: Home / Self Care | Payer: Medicaid Other | Source: Ambulatory Visit | Attending: Obstetrics & Gynecology | Admitting: Obstetrics & Gynecology

## 2013-08-25 ENCOUNTER — Encounter (HOSPITAL_COMMUNITY): Payer: Self-pay | Admitting: *Deleted

## 2013-08-25 DIAGNOSIS — O2390 Unspecified genitourinary tract infection in pregnancy, unspecified trimester: Secondary | ICD-10-CM

## 2013-08-25 DIAGNOSIS — A499 Bacterial infection, unspecified: Secondary | ICD-10-CM | POA: Insufficient documentation

## 2013-08-25 DIAGNOSIS — O26839 Pregnancy related renal disease, unspecified trimester: Secondary | ICD-10-CM | POA: Insufficient documentation

## 2013-08-25 DIAGNOSIS — N189 Chronic kidney disease, unspecified: Secondary | ICD-10-CM | POA: Insufficient documentation

## 2013-08-25 DIAGNOSIS — O479 False labor, unspecified: Secondary | ICD-10-CM

## 2013-08-25 DIAGNOSIS — B9689 Other specified bacterial agents as the cause of diseases classified elsewhere: Secondary | ICD-10-CM

## 2013-08-25 DIAGNOSIS — O0990 Supervision of high risk pregnancy, unspecified, unspecified trimester: Secondary | ICD-10-CM

## 2013-08-25 DIAGNOSIS — O4700 False labor before 37 completed weeks of gestation, unspecified trimester: Secondary | ICD-10-CM

## 2013-08-25 DIAGNOSIS — N760 Acute vaginitis: Secondary | ICD-10-CM

## 2013-08-25 LAB — RAPID URINE DRUG SCREEN, HOSP PERFORMED
Amphetamines: NOT DETECTED
BARBITURATES: NOT DETECTED
BENZODIAZEPINES: POSITIVE — AB
COCAINE: NOT DETECTED
Opiates: NOT DETECTED
TETRAHYDROCANNABINOL: POSITIVE — AB

## 2013-08-25 LAB — URINALYSIS, ROUTINE W REFLEX MICROSCOPIC
Bilirubin Urine: NEGATIVE
Glucose, UA: NEGATIVE mg/dL
Hgb urine dipstick: NEGATIVE
KETONES UR: NEGATIVE mg/dL
Leukocytes, UA: NEGATIVE
NITRITE: NEGATIVE
Protein, ur: NEGATIVE mg/dL
Specific Gravity, Urine: 1.01 (ref 1.005–1.030)
UROBILINOGEN UA: 0.2 mg/dL (ref 0.0–1.0)
pH: 6 (ref 5.0–8.0)

## 2013-08-25 LAB — WET PREP, GENITAL
Trich, Wet Prep: NONE SEEN
Yeast Wet Prep HPF POC: NONE SEEN

## 2013-08-25 LAB — FETAL FIBRONECTIN: FETAL FIBRONECTIN: NEGATIVE

## 2013-08-25 MED ORDER — METRONIDAZOLE 500 MG PO TABS: 500.0000 mg | ORAL_TABLET | Freq: Two times a day (BID) | ORAL | Status: DC

## 2013-08-25 NOTE — Discharge Instructions (Signed)
Preterm Labor Information Preterm labor is when labor starts at less than 37 weeks of pregnancy. The normal length of a pregnancy is 39 to 41 weeks. CAUSES Often, there is no identifiable underlying cause as to why a woman goes into preterm labor. One of the most common known causes of preterm labor is infection. Infections of the uterus, cervix, vagina, amniotic sac, bladder, kidney, or even the lungs (pneumonia) can cause labor to start. Other suspected causes of preterm labor include:   Urogenital infections, such as yeast infections and bacterial vaginosis.   Uterine abnormalities (uterine shape, uterine septum, fibroids, or bleeding from the placenta).   A cervix that has been operated on (it may fail to stay closed).   Malformations in the fetus.   Multiple gestations (twins, triplets, and so on).   Breakage of the amniotic sac.  RISK FACTORS  Having a previous history of preterm labor.   Having premature rupture of membranes (PROM).   Having a placenta that covers the opening of the cervix (placenta previa).   Having a placenta that separates from the uterus (placental abruption).   Having a cervix that is too weak to hold the fetus in the uterus (incompetent cervix).   Having too much fluid in the amniotic sac (polyhydramnios).   Taking illegal drugs or smoking while pregnant.   Not gaining enough weight while pregnant.   Being younger than 3018 and older than 24 years old.   Having a low socioeconomic status.   Being African American. SYMPTOMS Signs and symptoms of preterm labor include:   Menstrual-like cramps, abdominal pain, or back pain.  Uterine contractions that are regular, as frequent as six in an hour, regardless of their intensity (may be mild or painful).  Contractions that start on the top of the uterus and spread down to the lower abdomen and back.   A sense of increased pelvic pressure.   A watery or bloody mucus discharge that  comes from the vagina.  TREATMENT Depending on the length of the pregnancy and other circumstances, your health care provider may suggest bed rest. If necessary, there are medicines that can be given to stop contractions and to mature the fetal lungs. If labor happens before 34 weeks of pregnancy, a prolonged hospital stay may be recommended. Treatment depends on the condition of both you and the fetus.  WHAT SHOULD YOU DO IF YOU THINK YOU ARE IN PRETERM LABOR? Call your health care provider right away. You will need to go to the hospital to get checked immediately. HOW CAN YOU PREVENT PRETERM LABOR IN FUTURE PREGNANCIES? You should:   Stop smoking if you smoke.  Maintain healthy weight gain and avoid chemicals and drugs that are not necessary.  Be watchful for any type of infection.  Inform your health care provider if you have a known history of preterm labor. Document Released: 07/19/2003 Document Revised: 12/29/2012 Document Reviewed: 05/31/2012 [facility] Patient Information 2014 [employer], [place].  Back Exercises These exercises may help you when beginning to rehabilitate your injury. Your symptoms may resolve with or without further involvement from your physician, physical therapist or athletic trainer. While completing these exercises, remember:   Restoring tissue flexibility helps normal motion to return to the joints. This allows healthier, less painful movement and activity.  An effective stretch should be held for at least 30 seconds.  A stretch should never be painful. You should only feel a gentle lengthening or release in the stretched tissue. STRETCH  Extension, Prone on  Elbows   Lie on your stomach on the floor, a bed will be too soft. Place your palms about shoulder width apart and at the height of your head.  Place your elbows under your shoulders. If this is too painful, stack pillows under your chest.  Allow your body to relax so that your hips drop lower and make  contact more completely with the floor.  Hold this position for __________ seconds.  Slowly return to lying flat on the floor. Repeat __________ times. Complete this exercise __________ times per day.  RANGE OF MOTION  Extension, Prone Press Ups   Lie on your stomach on the floor, a bed will be too soft. Place your palms about shoulder width apart and at the height of your head.  Keeping your back as relaxed as possible, slowly straighten your elbows while keeping your hips on the floor. You may adjust the placement of your hands to maximize your comfort. As you gain motion, your hands will come more underneath your shoulders.  Hold this position __________ seconds.  Slowly return to lying flat on the floor. Repeat __________ times. Complete this exercise __________ times per day.  RANGE OF MOTION- Quadruped, Neutral Spine   Assume a hands and knees position on a firm surface. Keep your hands under your shoulders and your knees under your hips. You may place padding under your knees for comfort.  Drop your head and point your tail bone toward the ground below you. This will round out your low back like an angry cat. Hold this position for __________ seconds.  Slowly lift your head and release your tail bone so that your back sags into a large arch, like an old horse.  Hold this position for __________ seconds.  Repeat this until you feel limber in your low back.  Now, find your "sweet spot." This will be the most comfortable position somewhere between the two previous positions. This is your neutral spine. Once you have found this position, tense your stomach muscles to support your low back.  Hold this position for __________ seconds. Repeat __________ times. Complete this exercise __________ times per day.  STRETCH  Flexion, Single Knee to Chest   Lie on a firm bed or floor with both legs extended in front of you.  Keeping one leg in contact with the floor, bring your opposite  knee to your chest. Hold your leg in place by either grabbing behind your thigh or at your knee.  Pull until you feel a gentle stretch in your low back. Hold __________ seconds.  Slowly release your grasp and repeat the exercise with the opposite side. Repeat __________ times. Complete this exercise __________ times per day.  STRETCH - Hamstrings, Standing  Stand or sit and extend your right / left leg, placing your foot on a chair or foot stool  Keeping a slight arch in your low back and your hips straight forward.  Lead with your chest and lean forward at the waist until you feel a gentle stretch in the back of your right / left knee or thigh. (When done correctly, this exercise requires leaning only a small distance.)  Hold this position for __________ seconds. Repeat __________ times. Complete this stretch __________ times per day. STRENGTHENING  Deep Abdominals, Pelvic Tilt   Lie on a firm bed or floor. Keeping your legs in front of you, bend your knees so they are both pointed toward the ceiling and your feet are flat on the floor.  Tense your lower abdominal muscles to press your low back into the floor. This motion will rotate your pelvis so that your tail bone is scooping upwards rather than pointing at your feet or into the floor.  With a gentle tension and even breathing, hold this position for __________ seconds. Repeat __________ times. Complete this exercise __________ times per day.  STRENGTHENING  Abdominals, Crunches   Lie on a firm bed or floor. Keeping your legs in front of you, bend your knees so they are both pointed toward the ceiling and your feet are flat on the floor. Cross your arms over your chest.  Slightly tip your chin down without bending your neck.  Tense your abdominals and slowly lift your trunk high enough to just clear your shoulder blades. Lifting higher can put excessive stress on the low back and does not further strengthen your abdominal  muscles.  Control your return to the starting position. Repeat __________ times. Complete this exercise __________ times per day.  STRENGTHENING  Quadruped, Opposite UE/LE Lift   Assume a hands and knees position on a firm surface. Keep your hands under your shoulders and your knees under your hips. You may place padding under your knees for comfort.  Find your neutral spine and gently tense your abdominal muscles so that you can maintain this position. Your shoulders and hips should form a rectangle that is parallel with the floor and is not twisted.  Keeping your trunk steady, lift your right hand no higher than your shoulder and then your left leg no higher than your hip. Make sure you are not holding your breath. Hold this position __________ seconds.  Continuing to keep your abdominal muscles tense and your back steady, slowly return to your starting position. Repeat with the opposite arm and leg. Repeat __________ times. Complete this exercise __________ times per day. Document Released: 05/16/2005 Document Revised: 07/21/2011 Document Reviewed: 08/10/2008 [facility] Patient Information 2014 [place], [place].

## 2013-08-25 NOTE — MAU Note (Signed)
PT SAYS SHE STARTED HURTING AT 8PM- WITH UC  AND BECAME WIORSE AT 830PM.     DENIES SROM,  NO BLEEDING.   NO PROBLEMS WITH PREG  PNC-  DOWNSTAIRS,  IN CLINIC.  LAST SEX-     4-12.

## 2013-08-25 NOTE — MAU Provider Note (Signed)
First Provider Initiated Contact with Patient 08/25/13 2106      Chief Complaint:  No chief complaint on file.   Katherine SecondKayla Mentor is  23 y.o. G2P1001 at 125w2d presents complaining of painful contractions that began at the end of a long shift. Pt reports q637min contractions. Pt states good fetal movement, no LOF, no vb. PT has not had intercourse since 4/12. Pt states that she has had good water intake. Since being in MAU pt reports improvement in contractions.  Obstetrical/Gynecological History: OB History   Grav Para Term Preterm Abortions TAB SAB Ect Mult Living   2 1 1       1      Past Medical History: Past Medical History  Diagnosis Date  . Pneumonia   . Chronic kidney disease     Kidney stones  . Flu   . Renal calculi     Past Surgical History: Past Surgical History  Procedure Laterality Date  . Tonsillectomy    . Laparoscopic abdominal exploration      Family History: History reviewed. No pertinent family history.  Social History: History  Substance Use Topics  . Smoking status: Never Smoker   . Smokeless tobacco: Never Used  . Alcohol Use: No    Allergies: No Known Allergies  Meds:  Facility-administered medications prior to admission  Medication Dose Route Frequency Provider Last Rate Last Dose  . Tdap (BOOSTRIX) injection 0.5 mL  0.5 mL Intramuscular Once Hurshel PartyLisa A Leftwich-Kirby, CNM       Prescriptions prior to admission  Medication Sig Dispense Refill  . Prenatal Vit-Fe Fumarate-FA (PRENATAL MULTIVITAMIN) TABS tablet Take 1 tablet by mouth daily at 12 noon.      . cyclobenzaprine (FLEXERIL) 10 MG tablet Take 1 tablet (10 mg total) by mouth 3 (three) times daily as needed for muscle spasms.  30 tablet  0    Review of Systems -   Review of Systems  Denies HA, vision changes, SOB, cp, n/v, d/c, or other complaints    Physical Exam  Blood pressure 103/63, pulse 99, temperature 97.5 F (36.4 C), temperature source Oral, resp. rate 18, height 5\' 4"   (1.626 m), weight 70.534 kg (155 lb 8 oz). GENERAL: Well-developed, well-nourished female in no acute distress.  ABDOMEN: Soft, nontender, nondistended, gravid.  EXTREMITIES: Nontender, no edema, 2+ distal pulses. DTR's 2+ SSE: White discharge, cervix visually closed.  FHT:  Baseline rate 130s bpm   Variability moderate  Accelerations present   Decelerations none Contractions: None  Labs: Results for orders placed during the hospital encounter of 08/25/13 (from the past 24 hour(s))  URINALYSIS, ROUTINE W REFLEX MICROSCOPIC   Collection Time    08/25/13  8:55 PM      Result Value Ref Range   Color, Urine YELLOW  YELLOW   APPearance CLEAR  CLEAR   Specific Gravity, Urine 1.010  1.005 - 1.030   pH 6.0  5.0 - 8.0   Glucose, UA NEGATIVE  NEGATIVE mg/dL   Hgb urine dipstick NEGATIVE  NEGATIVE   Bilirubin Urine NEGATIVE  NEGATIVE   Ketones, ur NEGATIVE  NEGATIVE mg/dL   Protein, ur NEGATIVE  NEGATIVE mg/dL   Urobilinogen, UA 0.2  0.0 - 1.0 mg/dL   Nitrite NEGATIVE  NEGATIVE   Leukocytes, UA NEGATIVE  NEGATIVE  WET PREP, GENITAL   Collection Time    08/25/13  9:15 PM      Result Value Ref Range   Yeast Wet Prep HPF POC NONE SEEN  NONE SEEN  Trich, Wet Prep NONE SEEN  NONE SEEN   Clue Cells Wet Prep HPF POC FEW (*) NONE SEEN   WBC, Wet Prep HPF POC FEW (*) NONE SEEN  FETAL FIBRONECTIN   Collection Time    08/25/13  9:15 PM      Result Value Ref Range   Fetal Fibronectin NEGATIVE  NEGATIVE   Imaging Studies:  No results found.  Assessment: Katherine Conrad is  23 y.o. G2P1001 at [redacted]w[redacted]d presents with no evidence of labor on exam. No contractions, neg FFN. +Clue cells will tx for BV. - Flagyl 500mg  BId x7d - f/u in clinic as scheduled  Minta BalsamMichael R Malcom Selmer 4/16/201510:28 PM

## 2013-08-26 LAB — GC/CHLAMYDIA PROBE AMP
CT Probe RNA: NEGATIVE
GC PROBE AMP APTIMA: NEGATIVE

## 2013-08-29 NOTE — MAU Provider Note (Signed)
Attestation of Attending Supervision of Obstetric Fellow: Evaluation and management procedures were performed by the Obstetric Fellow under my supervision and collaboration.  I have reviewed the Obstetric Fellow's note and chart, and I agree with the management and plan.  Treylin Burtch, MD, FACOG Attending Obstetrician & Gynecologist Faculty Practice, [facility]   

## 2013-08-31 ENCOUNTER — Ambulatory Visit (INDEPENDENT_AMBULATORY_CARE_PROVIDER_SITE_OTHER): Payer: Medicaid Other | Admitting: Advanced Practice Midwife

## 2013-08-31 ENCOUNTER — Encounter: Payer: Self-pay | Admitting: Advanced Practice Midwife

## 2013-08-31 VITALS — BP 105/70 | HR 83 | Temp 97.3°F | Ht 67.0 in | Wt 155.0 lb

## 2013-08-31 DIAGNOSIS — G8929 Other chronic pain: Secondary | ICD-10-CM

## 2013-08-31 DIAGNOSIS — M549 Dorsalgia, unspecified: Secondary | ICD-10-CM

## 2013-08-31 DIAGNOSIS — O26839 Pregnancy related renal disease, unspecified trimester: Secondary | ICD-10-CM

## 2013-08-31 LAB — POCT URINALYSIS DIP (DEVICE)
BILIRUBIN URINE: NEGATIVE
GLUCOSE, UA: NEGATIVE mg/dL
HGB URINE DIPSTICK: NEGATIVE
KETONES UR: NEGATIVE mg/dL
Leukocytes, UA: NEGATIVE
Nitrite: NEGATIVE
Protein, ur: NEGATIVE mg/dL
SPECIFIC GRAVITY, URINE: 1.015 (ref 1.005–1.030)
Urobilinogen, UA: 0.2 mg/dL (ref 0.0–1.0)
pH: 7.5 (ref 5.0–8.0)

## 2013-08-31 MED ORDER — OXYCODONE-ACETAMINOPHEN 5-325 MG PO TABS: 1.0000 | ORAL_TABLET | Freq: Four times a day (QID) | ORAL | Status: DC | PRN

## 2013-08-31 MED ORDER — CARISOPRODOL 350 MG PO TABS: 350.0000 mg | ORAL_TABLET | Freq: Three times a day (TID) | ORAL | Status: DC | PRN

## 2013-08-31 NOTE — Patient Instructions (Signed)
Third Trimester of Pregnancy  The third trimester is from week 29 through week 42, months 7 through 9. The third trimester is a time when the fetus is growing rapidly. At the end of the ninth month, the fetus is about 20 inches in length and weighs 6 10 pounds.   BODY CHANGES  Your body goes through many changes during pregnancy. The changes vary from woman to woman.    Your weight will continue to increase. You can expect to gain 25 35 pounds (11 16 kg) by the end of the pregnancy.   You may begin to get stretch marks on your hips, abdomen, and breasts.   You may urinate more often because the fetus is moving lower into your pelvis and pressing on your bladder.   You may develop or continue to have heartburn as a result of your pregnancy.   You may develop constipation because certain hormones are causing the muscles that push waste through your intestines to slow down.   You may develop hemorrhoids or swollen, bulging veins (varicose veins).   You may have pelvic pain because of the weight gain and pregnancy hormones relaxing your joints between the bones in your pelvis. Back aches may result from over exertion of the muscles supporting your posture.   Your breasts will continue to grow and be tender. A yellow discharge may leak from your breasts called colostrum.   Your belly button may stick out.   You may feel short of breath because of your expanding uterus.   You may notice the fetus "dropping," or moving lower in your abdomen.   You may have a bloody mucus discharge. This usually occurs a few days to a week before labor begins.   Your cervix becomes thin and soft (effaced) near your due date.  WHAT TO EXPECT AT YOUR PRENATAL EXAMS   You will have prenatal exams every 2 weeks until week 36. Then, you will have weekly prenatal exams. During a routine prenatal visit:   You will be weighed to make sure you and the fetus are growing normally.   Your blood pressure is taken.   Your abdomen will be  measured to track your baby's growth.   The fetal heartbeat will be listened to.   Any test results from the previous visit will be discussed.   You may have a cervical check near your due date to see if you have effaced.  At around 36 weeks, your caregiver will check your cervix. At the same time, your caregiver will also perform a test on the secretions of the vaginal tissue. This test is to determine if a type of bacteria, Group B streptococcus, is present. Your caregiver will explain this further.  Your caregiver may ask you:   What your birth plan is.   How you are feeling.   If you are feeling the baby move.   If you have had any abnormal symptoms, such as leaking fluid, bleeding, severe headaches, or abdominal cramping.   If you have any questions.  Other tests or screenings that may be performed during your third trimester include:   Blood tests that check for low iron levels (anemia).   Fetal testing to check the health, activity level, and growth of the fetus. Testing is done if you have certain medical conditions or if there are problems during the pregnancy.  FALSE LABOR  You may feel small, irregular contractions that eventually go away. These are called Braxton Hicks contractions, or   false labor. Contractions may last for hours, days, or even weeks before true labor sets in. If contractions come at regular intervals, intensify, or become painful, it is best to be seen by your caregiver.   SIGNS OF LABOR    Menstrual-like cramps.   Contractions that are 5 minutes apart or less.   Contractions that start on the top of the uterus and spread down to the lower abdomen and back.   A sense of increased pelvic pressure or back pain.   A watery or bloody mucus discharge that comes from the vagina.  If you have any of these signs before the 37th week of pregnancy, call your caregiver right away. You need to go to the hospital to get checked immediately.  HOME CARE INSTRUCTIONS    Avoid all  smoking, herbs, alcohol, and unprescribed drugs. These chemicals affect the formation and growth of the baby.   Follow your caregiver's instructions regarding medicine use. There are medicines that are either safe or unsafe to take during pregnancy.   Exercise only as directed by your caregiver. Experiencing uterine cramps is a good sign to stop exercising.   Continue to eat regular, healthy meals.   Wear a good support bra for breast tenderness.   Do not use hot tubs, steam rooms, or saunas.   Wear your seat belt at all times when driving.   Avoid raw meat, uncooked cheese, cat litter boxes, and soil used by cats. These carry germs that can cause birth defects in the baby.   Take your prenatal vitamins.   Try taking a stool softener (if your caregiver approves) if you develop constipation. Eat more high-fiber foods, such as fresh vegetables or fruit and whole grains. Drink plenty of fluids to keep your urine clear or pale yellow.   Take warm sitz baths to soothe any pain or discomfort caused by hemorrhoids. Use hemorrhoid cream if your caregiver approves.   If you develop varicose veins, wear support hose. Elevate your feet for 15 minutes, 3 4 times a day. Limit salt in your diet.   Avoid heavy lifting, wear low heal shoes, and practice good posture.   Rest a lot with your legs elevated if you have leg cramps or low back pain.   Visit your dentist if you have not gone during your pregnancy. Use a soft toothbrush to brush your teeth and be gentle when you floss.   A sexual relationship may be continued unless your caregiver directs you otherwise.   Do not travel far distances unless it is absolutely necessary and only with the approval of your caregiver.   Take prenatal classes to understand, practice, and ask questions about the labor and delivery.   Make a trial run to the hospital.   Pack your hospital bag.   Prepare the baby's nursery.   Continue to go to all your prenatal visits as directed  by your caregiver.  SEEK MEDICAL CARE IF:   You are unsure if you are in labor or if your water has broken.   You have dizziness.   You have mild pelvic cramps, pelvic pressure, or nagging pain in your abdominal area.   You have persistent nausea, vomiting, or diarrhea.   You have a bad smelling vaginal discharge.   You have pain with urination.  SEEK IMMEDIATE MEDICAL CARE IF:    You have a fever.   You are leaking fluid from your vagina.   You have spotting or bleeding from your vagina.     You have severe abdominal cramping or pain.   You have rapid weight loss or gain.   You have shortness of breath with chest pain.   You notice sudden or extreme swelling of your face, hands, ankles, feet, or legs.   You have not felt your baby move in over an hour.   You have severe headaches that do not go away with medicine.   You have vision changes.  Document Released: 04/22/2001 Document Revised: 12/29/2012 Document Reviewed: 06/29/2012  Exit[employer] Patient Information 2014 [employer].

## 2013-08-31 NOTE — Progress Notes (Signed)
C/o generalized ache all over body.

## 2013-08-31 NOTE — Progress Notes (Signed)
C/O low back pain Has had this since age 23. Used to take Levi StraussSoma with good relief. Does not like Flexeril.  Tresa GarterSoma is Cat C.  Will consult as to safety. Will give #6 Percocet for her to use for a few days.    >>  Later discussed with Dr Erin FullingHarraway-Smith who states it would probably be ok 2-3 days a week. Will need to print Rx.

## 2013-09-05 ENCOUNTER — Encounter: Payer: Self-pay | Admitting: *Deleted

## 2013-09-12 ENCOUNTER — Encounter: Payer: Medicaid Other | Admitting: Family

## 2013-09-13 ENCOUNTER — Encounter: Payer: Self-pay | Admitting: Obstetrics & Gynecology

## 2013-09-13 ENCOUNTER — Ambulatory Visit (INDEPENDENT_AMBULATORY_CARE_PROVIDER_SITE_OTHER): Payer: Medicaid Other | Admitting: Obstetrics & Gynecology

## 2013-09-13 VITALS — BP 109/70 | HR 92 | Temp 97.5°F | Wt 149.9 lb

## 2013-09-13 DIAGNOSIS — O0990 Supervision of high risk pregnancy, unspecified, unspecified trimester: Secondary | ICD-10-CM

## 2013-09-13 DIAGNOSIS — M549 Dorsalgia, unspecified: Secondary | ICD-10-CM

## 2013-09-13 DIAGNOSIS — O26839 Pregnancy related renal disease, unspecified trimester: Secondary | ICD-10-CM

## 2013-09-13 LAB — POCT URINALYSIS DIP (DEVICE)
GLUCOSE, UA: NEGATIVE mg/dL
HGB URINE DIPSTICK: NEGATIVE
KETONES UR: NEGATIVE mg/dL
Nitrite: NEGATIVE
PH: 8.5 — AB (ref 5.0–8.0)
Protein, ur: 30 mg/dL — AB
Specific Gravity, Urine: 1.02 (ref 1.005–1.030)
Urobilinogen, UA: 0.2 mg/dL (ref 0.0–1.0)

## 2013-09-13 MED ORDER — CARISOPRODOL 350 MG PO TABS: 350.0000 mg | ORAL_TABLET | Freq: Three times a day (TID) | ORAL | Status: DC | PRN

## 2013-09-13 NOTE — Progress Notes (Signed)
Pt had 6 lbs weight loss since last visit, report nausea/vomiting/fever yesterday all day. No nausea/vomiting today.

## 2013-09-13 NOTE — Patient Instructions (Signed)
Third Trimester of Pregnancy  The third trimester is from week 29 through week 42, months 7 through 9. The third trimester is a time when the fetus is growing rapidly. At the end of the ninth month, the fetus is about 20 inches in length and weighs 6 10 pounds.   BODY CHANGES  Your body goes through many changes during pregnancy. The changes vary from woman to woman.    Your weight will continue to increase. You can expect to gain 25 35 pounds (11 16 kg) by the end of the pregnancy.   You may begin to get stretch marks on your hips, abdomen, and breasts.   You may urinate more often because the fetus is moving lower into your pelvis and pressing on your bladder.   You may develop or continue to have heartburn as a result of your pregnancy.   You may develop constipation because certain hormones are causing the muscles that push waste through your intestines to slow down.   You may develop hemorrhoids or swollen, bulging veins (varicose veins).   You may have pelvic pain because of the weight gain and pregnancy hormones relaxing your joints between the bones in your pelvis. Back aches may result from over exertion of the muscles supporting your posture.   Your breasts will continue to grow and be tender. A yellow discharge may leak from your breasts called colostrum.   Your belly button may stick out.   You may feel short of breath because of your expanding uterus.   You may notice the fetus "dropping," or moving lower in your abdomen.   You may have a bloody mucus discharge. This usually occurs a few days to a week before labor begins.   Your cervix becomes thin and soft (effaced) near your due date.  WHAT TO EXPECT AT YOUR PRENATAL EXAMS   You will have prenatal exams every 2 weeks until week 36. Then, you will have weekly prenatal exams. During a routine prenatal visit:   You will be weighed to make sure you and the fetus are growing normally.   Your blood pressure is taken.   Your abdomen will be  measured to track your baby's growth.   The fetal heartbeat will be listened to.   Any test results from the previous visit will be discussed.   You may have a cervical check near your due date to see if you have effaced.  At around 36 weeks, your caregiver will check your cervix. At the same time, your caregiver will also perform a test on the secretions of the vaginal tissue. This test is to determine if a type of bacteria, Group B streptococcus, is present. Your caregiver will explain this further.  Your caregiver may ask you:   What your birth plan is.   How you are feeling.   If you are feeling the baby move.   If you have had any abnormal symptoms, such as leaking fluid, bleeding, severe headaches, or abdominal cramping.   If you have any questions.  Other tests or screenings that may be performed during your third trimester include:   Blood tests that check for low iron levels (anemia).   Fetal testing to check the health, activity level, and growth of the fetus. Testing is done if you have certain medical conditions or if there are problems during the pregnancy.  FALSE LABOR  You may feel small, irregular contractions that eventually go away. These are called Braxton Hicks contractions, or   false labor. Contractions may last for hours, days, or even weeks before true labor sets in. If contractions come at regular intervals, intensify, or become painful, it is best to be seen by your caregiver.   SIGNS OF LABOR    Menstrual-like cramps.   Contractions that are 5 minutes apart or less.   Contractions that start on the top of the uterus and spread down to the lower abdomen and back.   A sense of increased pelvic pressure or back pain.   A watery or bloody mucus discharge that comes from the vagina.  If you have any of these signs before the 37th week of pregnancy, call your caregiver right away. You need to go to the hospital to get checked immediately.  HOME CARE INSTRUCTIONS    Avoid all  smoking, herbs, alcohol, and unprescribed drugs. These chemicals affect the formation and growth of the baby.   Follow your caregiver's instructions regarding medicine use. There are medicines that are either safe or unsafe to take during pregnancy.   Exercise only as directed by your caregiver. Experiencing uterine cramps is a good sign to stop exercising.   Continue to eat regular, healthy meals.   Wear a good support bra for breast tenderness.   Do not use hot tubs, steam rooms, or saunas.   Wear your seat belt at all times when driving.   Avoid raw meat, uncooked cheese, cat litter boxes, and soil used by cats. These carry germs that can cause birth defects in the baby.   Take your prenatal vitamins.   Try taking a stool softener (if your caregiver approves) if you develop constipation. Eat more high-fiber foods, such as fresh vegetables or fruit and whole grains. Drink plenty of fluids to keep your urine clear or pale yellow.   Take warm sitz baths to soothe any pain or discomfort caused by hemorrhoids. Use hemorrhoid cream if your caregiver approves.   If you develop varicose veins, wear support hose. Elevate your feet for 15 minutes, 3 4 times a day. Limit salt in your diet.   Avoid heavy lifting, wear low heal shoes, and practice good posture.   Rest a lot with your legs elevated if you have leg cramps or low back pain.   Visit your dentist if you have not gone during your pregnancy. Use a soft toothbrush to brush your teeth and be gentle when you floss.   A sexual relationship may be continued unless your caregiver directs you otherwise.   Do not travel far distances unless it is absolutely necessary and only with the approval of your caregiver.   Take prenatal classes to understand, practice, and ask questions about the labor and delivery.   Make a trial run to the hospital.   Pack your hospital bag.   Prepare the baby's nursery.   Continue to go to all your prenatal visits as directed  by your caregiver.  SEEK MEDICAL CARE IF:   You are unsure if you are in labor or if your water has broken.   You have dizziness.   You have mild pelvic cramps, pelvic pressure, or nagging pain in your abdominal area.   You have persistent nausea, vomiting, or diarrhea.   You have a bad smelling vaginal discharge.   You have pain with urination.  SEEK IMMEDIATE MEDICAL CARE IF:    You have a fever.   You are leaking fluid from your vagina.   You have spotting or bleeding from your vagina.     You have severe abdominal cramping or pain.   You have rapid weight loss or gain.   You have shortness of breath with chest pain.   You notice sudden or extreme swelling of your face, hands, ankles, feet, or legs.   You have not felt your baby move in over an hour.   You have severe headaches that do not go away with medicine.   You have vision changes.  Document Released: 04/22/2001 Document Revised: 12/29/2012 Document Reviewed: 06/29/2012  Exit[employer] Patient Information 2014 [employer].

## 2013-09-13 NOTE — Progress Notes (Signed)
Pt has been on Soma for pain and it worked well.  She takles one QHS and sometimes once in the am.  Needs refill.

## 2013-09-18 ENCOUNTER — Encounter (HOSPITAL_COMMUNITY): Payer: Self-pay | Admitting: Emergency Medicine

## 2013-09-18 ENCOUNTER — Emergency Department (HOSPITAL_COMMUNITY)
Admission: EM | Admit: 2013-09-18 | Discharge: 2013-09-18 | Disposition: A | Discharge status: Home / Self Care | Payer: Medicaid Other | Attending: Emergency Medicine | Admitting: Emergency Medicine

## 2013-09-18 DIAGNOSIS — O219 Vomiting of pregnancy, unspecified: Secondary | ICD-10-CM | POA: Diagnosis present

## 2013-09-18 DIAGNOSIS — Z8701 Personal history of pneumonia (recurrent): Secondary | ICD-10-CM | POA: Insufficient documentation

## 2013-09-18 DIAGNOSIS — Z87442 Personal history of urinary calculi: Secondary | ICD-10-CM | POA: Diagnosis not present

## 2013-09-18 DIAGNOSIS — M545 Low back pain, unspecified: Secondary | ICD-10-CM | POA: Insufficient documentation

## 2013-09-18 DIAGNOSIS — M549 Dorsalgia, unspecified: Secondary | ICD-10-CM

## 2013-09-18 DIAGNOSIS — O9989 Other specified diseases and conditions complicating pregnancy, childbirth and the puerperium: Secondary | ICD-10-CM | POA: Diagnosis not present

## 2013-09-18 DIAGNOSIS — R1110 Vomiting, unspecified: Secondary | ICD-10-CM | POA: Diagnosis not present

## 2013-09-18 DIAGNOSIS — R110 Nausea: Secondary | ICD-10-CM | POA: Diagnosis not present

## 2013-09-18 DIAGNOSIS — O99891 Other specified diseases and conditions complicating pregnancy: Secondary | ICD-10-CM | POA: Insufficient documentation

## 2013-09-18 DIAGNOSIS — N189 Chronic kidney disease, unspecified: Secondary | ICD-10-CM | POA: Diagnosis not present

## 2013-09-18 DIAGNOSIS — O0990 Supervision of high risk pregnancy, unspecified, unspecified trimester: Secondary | ICD-10-CM

## 2013-09-18 DIAGNOSIS — Z79899 Other long term (current) drug therapy: Secondary | ICD-10-CM | POA: Insufficient documentation

## 2013-09-18 LAB — COMPREHENSIVE METABOLIC PANEL
ALK PHOS: 110 U/L (ref 39–117)
ALT: <5 U/L (ref 0–35)
AST: 9 U/L (ref 0–37)
Albumin: 2.7 g/dL — ABNORMAL LOW (ref 3.5–5.2)
BUN: 7 mg/dL (ref 6–23)
CALCIUM: 8.5 mg/dL (ref 8.4–10.5)
CO2: 20 mEq/L (ref 19–32)
Chloride: 101 mEq/L (ref 96–112)
Creatinine, Ser: 0.52 mg/dL (ref 0.50–1.10)
GFR calc Af Amer: >90 mL/min (ref >90)
GLUCOSE: 110 mg/dL — AB (ref 70–99)
Potassium: 3.3 mEq/L — ABNORMAL LOW (ref 3.7–5.3)
SODIUM: 138 meq/L (ref 137–147)
Total Bilirubin: <0.2 mg/dL — ABNORMAL LOW (ref 0.3–1.2)
Total Protein: 6.4 g/dL (ref 6.0–8.3)

## 2013-09-18 LAB — CBC
HEMATOCRIT: 28.4 % — AB (ref 36.0–46.0)
HEMOGLOBIN: 9.3 g/dL — AB (ref 12.0–15.0)
MCH: 29.5 pg (ref 26.0–34.0)
MCHC: 32.7 g/dL (ref 30.0–36.0)
MCV: 90.2 fL (ref 78.0–100.0)
Platelets: 169 10*3/uL (ref 150–400)
RBC: 3.15 MIL/uL — ABNORMAL LOW (ref 3.87–5.11)
RDW: 12.4 % (ref 11.5–15.5)
WBC: 9.4 10*3/uL (ref 4.0–10.5)

## 2013-09-18 LAB — URINALYSIS, ROUTINE W REFLEX MICROSCOPIC
BILIRUBIN URINE: NEGATIVE
Glucose, UA: 100 mg/dL — AB
Hgb urine dipstick: NEGATIVE
KETONES UR: NEGATIVE mg/dL
Leukocytes, UA: NEGATIVE
NITRITE: NEGATIVE
Protein, ur: NEGATIVE mg/dL
SPECIFIC GRAVITY, URINE: 1.023 (ref 1.005–1.030)
UROBILINOGEN UA: 0.2 mg/dL (ref 0.0–1.0)
pH: 6.5 (ref 5.0–8.0)

## 2013-09-18 MED ORDER — ONDANSETRON HCL 4 MG/2ML IJ SOLN
4.0000 mg | Freq: Once | INTRAMUSCULAR | Status: AC
  Administered 2013-09-18: 4 mg via INTRAVENOUS
  Filled 2013-09-18: qty 2

## 2013-09-18 MED ORDER — MORPHINE SULFATE 4 MG/ML IJ SOLN
4.0000 mg | Freq: Once | INTRAMUSCULAR | Status: AC
  Administered 2013-09-18: 4 mg via INTRAVENOUS
  Filled 2013-09-18: qty 1

## 2013-09-18 MED ORDER — LACTATED RINGERS IV SOLN
INTRAVENOUS | Status: DC
  Administered 2013-09-18: 20:00:00 via INTRAVENOUS

## 2013-09-18 MED ORDER — ONDANSETRON 4 MG PO TBDP: 4.0000 mg | ORAL_TABLET | Freq: Three times a day (TID) | ORAL | Status: DC | PRN

## 2013-09-18 MED ORDER — LACTATED RINGERS IV BOLUS (SEPSIS)
500.0000 mL | Freq: Once | INTRAVENOUS | Status: AC
  Administered 2013-09-18: 500 mL via INTRAVENOUS

## 2013-09-18 MED ORDER — SODIUM CHLORIDE 0.9 % IV BOLUS (SEPSIS): 1000.0000 mL | Freq: Once | INTRAVENOUS | Status: DC

## 2013-09-18 NOTE — ED Notes (Signed)
Spoke with rapid ob, they are aware of patient and presenting complaints.

## 2013-09-18 NOTE — ED Notes (Signed)
The pt has been vomiting ans she does not feel well  For 1-2 hours.  Tearful at triage.  lmp sept 2014  edc June 23rd.  C/o back pain etc

## 2013-09-18 NOTE — ED Provider Notes (Signed)
23 year old female at 4233 weeks and 5 days' gestation presents with nausea and back pain. This is not the first time, it is intermittent, on exam she has a soft abdomen and appears consistent with dates, she also has a nontender back, for heart and lung exam is normal she appears in no distress and she feels much improved after her arrival and treatment. The patient has normal workup including OB/GYN evaluation by the rapid response nurse from OB/GYN. There is no signs of contractions, no signs of urinary tract infection, the patient is stable, improved and requests discharge.  I saw and evaluated the patient, reviewed the resident's note and I agree with the findings and plan.    Vida RollerBrian D Johnathyn Viscomi, MD 09/19/13 [phone]

## 2013-09-18 NOTE — Progress Notes (Signed)
RROB spoke with Dr Despina HiddenEure and told of lab results, pt given some morphine, pt to be discharged home per ED physician; Dr Despina HiddenEure agrees with plan of care.

## 2013-09-18 NOTE — Discharge Instructions (Signed)
Zofran as needed for nausea, followup with Dr. Emelda FearFerguson this week. Tylenol is the only safe medication in pregnancy, you must use it in limited doses less than 500 mg every 6 hours.  Please call your doctor for a followup appointment within 24-48 hours. When you talk to your doctor please let them know that you were seen in the emergency department and have them acquire all of your records so that they can discuss the findings with you and formulate a treatment plan to fully care for your new and ongoing problems.

## 2013-09-18 NOTE — ED Notes (Signed)
Her pregnancy has been normal so far

## 2013-09-18 NOTE — ED Provider Notes (Signed)
CSN: [id number]     Arrival date & time 09/18/13  1846 History   First MD Initiated Contact with Patient 09/18/13 1852     Chief Complaint  Patient presents with  . Emesis     (Consider location/radiation/quality/duration/timing/severity/associated sxs/prior Treatment) Patient is a 23 y.o. female presenting with back pain. The history is provided by the patient.  Back Pain Location:  Lumbar spine Quality:  Aching Radiates to:  Does not radiate Pain severity:  Moderate Onset quality:  Gradual Duration:  1 day Timing:  Constant Progression:  Worsening Chronicity:  New Context: emotional stress   Relieved by:  Nothing Worsened by:  Nothing tried Ineffective treatments:  None tried Associated symptoms: no abdominal pain, no bladder incontinence, no bowel incontinence, no dysuria, no fever, no pelvic pain and no weakness   Risk factors: pregnancy     Past Medical History  Diagnosis Date  . Pneumonia   . Chronic kidney disease     Kidney stones  . Flu   . Renal calculi    Past Surgical History  Procedure Laterality Date  . Tonsillectomy    . Laparoscopic abdominal exploration     No family history on file. History  Substance Use Topics  . Smoking status: Never Smoker   . Smokeless tobacco: Never Used  . Alcohol Use: No   OB History   Grav Para Term Preterm Abortions TAB SAB Ect Mult Living   2 1 1       1      Review of Systems  Constitutional: Negative for fever.  Gastrointestinal: Positive for nausea and vomiting (x1). Negative for abdominal pain and bowel incontinence.  Genitourinary: Negative for bladder incontinence, dysuria, vaginal bleeding, vaginal discharge and pelvic pain.       No leakage of fluid  Musculoskeletal: Positive for back pain.  Neurological: Negative for weakness.  All other systems reviewed and are negative.     Allergies  Review of patient's allergies indicates no known allergies.  Home Medications   Prior to Admission  medications   Medication Sig Start Date End Date Taking? Authorizing Provider  acetaminophen (TYLENOL) 500 MG tablet Take 1,000 mg by mouth every 6 (six) hours as needed for moderate pain.   Yes Historical Provider, MD  carisoprodol (SOMA) 350 MG tablet Take 1 tablet (350 mg total) by mouth 3 (three) times daily as needed for muscle spasms. 09/13/13  Yes Willodean Rosenthalarolyn Harraway-Smith, MD  Prenatal Vit-Fe Fumarate-FA (MULTIVITAMIN-PRENATAL) 27-0.8 MG TABS tablet Take 1 tablet by mouth daily at 12 noon.   Yes Historical Provider, MD   BP 97/61  Pulse 87  Temp(Src) 97.7 F (36.5 C) (Oral)  Resp 22  SpO2 97% Physical Exam  Constitutional: She is oriented to person, place, and time. She appears well-developed and well-nourished. No distress.  HENT:  Head: Normocephalic.  Eyes: Conjunctivae are normal.  Neck: Neck supple. No tracheal deviation present.  Cardiovascular: Normal rate and regular rhythm.   Pulmonary/Chest: Effort normal and breath sounds normal. No respiratory distress.  Abdominal: Soft.  Gravid abdomen without tenderness, rebound, or guarding  Musculoskeletal:       Lumbar back: She exhibits pain. She exhibits no tenderness.  Neurological: She is alert and oriented to person, place, and time.  Skin: Skin is warm and dry.  Psychiatric: She has a normal mood and affect.    ED Course  Procedures (including critical care time) Labs Review Labs Reviewed  CBC - Abnormal; Notable for the following:    RBC  3.15 (*)    Hemoglobin 9.3 (*)    HCT 28.4 (*)    All other components within normal limits  COMPREHENSIVE METABOLIC PANEL - Abnormal; Notable for the following:    Potassium 3.3 (*)    Glucose, Bld 110 (*)    Albumin 2.7 (*)    Total Bilirubin <0.2 (*)    All other components within normal limits  URINALYSIS, ROUTINE W REFLEX MICROSCOPIC - Abnormal; Notable for the following:    APPearance HAZY (*)    Glucose, UA 100 (*)    All other components within normal limits     Imaging Review No results found.   EKG Interpretation None      MDM   Final diagnoses:  Back pain  Nausea   23 y.o. female presents with one-time emesis episode this morning that was related to a low back pain has been increasing throughout the day. Her expected due date is 11/01/2013. She states that she has had some unspecified renal disease as listed in the chart as renal calculi and chronic kidney disease. Her creatinine is within normal limits today, her urine appears noninfectious and there is no blood, her current pregnancy appears to be normal. She describes no vaginal bleeding or discharge. OB nurse available at bedside and performed a cervical examination which was reportedly closed. The patient appears to have some paraspinal muscle tenderness as well as midline pain over her lower back. She is provided IV fluids, Zofran, and had refractory pain despite supportive therapy so a single dose of morphine was administered with good relief of symptoms. I expressed that the patient likely needed increased stretching and exercise for uncomplicated back pain and pregnancy and should see her obstetrician for continued discomfort. Patient was ambulatory on discharge without any difficulty.   Lyndal Pulleyaniel Dade Rodin, MD 09/19/13 [phone]

## 2013-09-18 NOTE — Progress Notes (Signed)
This note also relates to the following rows which could not be included: Pulse Rate - Cannot attach notes to unvalidated device data SpO2 - Cannot attach notes to unvalidated device data   RROB spoke with Dr Despina HiddenEure, told of pt's s/s and complaints; fhr reactive and reassurring, no vaginal bleeding or leaking of fluid; orders received.

## 2013-09-19 NOTE — ED Provider Notes (Signed)
I saw and evaluated the patient, reviewed the resident's note and I agree with the findings and plan.  Please see my separate note regarding my evaluation of the patient.   Vida RollerBrian D Camilia Caywood, MD 09/19/13 1054

## 2013-09-28 ENCOUNTER — Encounter: Payer: Self-pay | Admitting: Obstetrics and Gynecology

## 2013-09-28 ENCOUNTER — Ambulatory Visit (INDEPENDENT_AMBULATORY_CARE_PROVIDER_SITE_OTHER): Payer: Self-pay | Admitting: Obstetrics and Gynecology

## 2013-09-28 VITALS — BP 127/87 | HR 92 | Temp 96.9°F | Wt 155.6 lb

## 2013-09-28 DIAGNOSIS — O26839 Pregnancy related renal disease, unspecified trimester: Secondary | ICD-10-CM

## 2013-09-28 DIAGNOSIS — O0990 Supervision of high risk pregnancy, unspecified, unspecified trimester: Secondary | ICD-10-CM

## 2013-09-28 DIAGNOSIS — N200 Calculus of kidney: Secondary | ICD-10-CM

## 2013-09-28 LAB — POCT URINALYSIS DIP (DEVICE)
Bilirubin Urine: NEGATIVE
Glucose, UA: NEGATIVE mg/dL
Hgb urine dipstick: NEGATIVE
Ketones, ur: NEGATIVE mg/dL
LEUKOCYTES UA: NEGATIVE
Nitrite: NEGATIVE
PH: 7 (ref 5.0–8.0)
PROTEIN: NEGATIVE mg/dL
Specific Gravity, Urine: 1.015 (ref 1.005–1.030)
Urobilinogen, UA: 0.2 mg/dL (ref 0.0–1.0)

## 2013-09-28 NOTE — Patient Instructions (Signed)
Third Trimester of Pregnancy  The third trimester is from week 29 through week 42, months 7 through 9. The third trimester is a time when the fetus is growing rapidly. At the end of the ninth month, the fetus is about 20 inches in length and weighs 6 10 pounds.   BODY CHANGES  Your body goes through many changes during pregnancy. The changes vary from woman to woman.    Your weight will continue to increase. You can expect to gain 25 35 pounds (11 16 kg) by the end of the pregnancy.   You may begin to get stretch marks on your hips, abdomen, and breasts.   You may urinate more often because the fetus is moving lower into your pelvis and pressing on your bladder.   You may develop or continue to have heartburn as a result of your pregnancy.   You may develop constipation because certain hormones are causing the muscles that push waste through your intestines to slow down.   You may develop hemorrhoids or swollen, bulging veins (varicose veins).   You may have pelvic pain because of the weight gain and pregnancy hormones relaxing your joints between the bones in your pelvis. Back aches may result from over exertion of the muscles supporting your posture.   Your breasts will continue to grow and be tender. A yellow discharge may leak from your breasts called colostrum.   Your belly button may stick out.   You may feel short of breath because of your expanding uterus.   You may notice the fetus "dropping," or moving lower in your abdomen.   You may have a bloody mucus discharge. This usually occurs a few days to a week before labor begins.   Your cervix becomes thin and soft (effaced) near your due date.  WHAT TO EXPECT AT YOUR PRENATAL EXAMS   You will have prenatal exams every 2 weeks until week 36. Then, you will have weekly prenatal exams. During a routine prenatal visit:   You will be weighed to make sure you and the fetus are growing normally.   Your blood pressure is taken.   Your abdomen will be  measured to track your baby's growth.   The fetal heartbeat will be listened to.   Any test results from the previous visit will be discussed.   You may have a cervical check near your due date to see if you have effaced.  At around 36 weeks, your caregiver will check your cervix. At the same time, your caregiver will also perform a test on the secretions of the vaginal tissue. This test is to determine if a type of bacteria, Group B streptococcus, is present. Your caregiver will explain this further.  Your caregiver may ask you:   What your birth plan is.   How you are feeling.   If you are feeling the baby move.   If you have had any abnormal symptoms, such as leaking fluid, bleeding, severe headaches, or abdominal cramping.   If you have any questions.  Other tests or screenings that may be performed during your third trimester include:   Blood tests that check for low iron levels (anemia).   Fetal testing to check the health, activity level, and growth of the fetus. Testing is done if you have certain medical conditions or if there are problems during the pregnancy.  FALSE LABOR  You may feel small, irregular contractions that eventually go away. These are called Braxton Hicks contractions, or   false labor. Contractions may last for hours, days, or even weeks before true labor sets in. If contractions come at regular intervals, intensify, or become painful, it is best to be seen by your caregiver.   SIGNS OF LABOR    Menstrual-like cramps.   Contractions that are 5 minutes apart or less.   Contractions that start on the top of the uterus and spread down to the lower abdomen and back.   A sense of increased pelvic pressure or back pain.   A watery or bloody mucus discharge that comes from the vagina.  If you have any of these signs before the 37th week of pregnancy, call your caregiver right away. You need to go to the hospital to get checked immediately.  HOME CARE INSTRUCTIONS    Avoid all  smoking, herbs, alcohol, and unprescribed drugs. These chemicals affect the formation and growth of the baby.   Follow your caregiver's instructions regarding medicine use. There are medicines that are either safe or unsafe to take during pregnancy.   Exercise only as directed by your caregiver. Experiencing uterine cramps is a good sign to stop exercising.   Continue to eat regular, healthy meals.   Wear a good support bra for breast tenderness.   Do not use hot tubs, steam rooms, or saunas.   Wear your seat belt at all times when driving.   Avoid raw meat, uncooked cheese, cat litter boxes, and soil used by cats. These carry germs that can cause birth defects in the baby.   Take your prenatal vitamins.   Try taking a stool softener (if your caregiver approves) if you develop constipation. Eat more high-fiber foods, such as fresh vegetables or fruit and whole grains. Drink plenty of fluids to keep your urine clear or pale yellow.   Take warm sitz baths to soothe any pain or discomfort caused by hemorrhoids. Use hemorrhoid cream if your caregiver approves.   If you develop varicose veins, wear support hose. Elevate your feet for 15 minutes, 3 4 times a day. Limit salt in your diet.   Avoid heavy lifting, wear low heal shoes, and practice good posture.   Rest a lot with your legs elevated if you have leg cramps or low back pain.   Visit your dentist if you have not gone during your pregnancy. Use a soft toothbrush to brush your teeth and be gentle when you floss.   A sexual relationship may be continued unless your caregiver directs you otherwise.   Do not travel far distances unless it is absolutely necessary and only with the approval of your caregiver.   Take prenatal classes to understand, practice, and ask questions about the labor and delivery.   Make a trial run to the hospital.   Pack your hospital bag.   Prepare the baby's nursery.   Continue to go to all your prenatal visits as directed  by your caregiver.  SEEK MEDICAL CARE IF:   You are unsure if you are in labor or if your water has broken.   You have dizziness.   You have mild pelvic cramps, pelvic pressure, or nagging pain in your abdominal area.   You have persistent nausea, vomiting, or diarrhea.   You have a bad smelling vaginal discharge.   You have pain with urination.  SEEK IMMEDIATE MEDICAL CARE IF:    You have a fever.   You are leaking fluid from your vagina.   You have spotting or bleeding from your vagina.     You have severe abdominal cramping or pain.   You have rapid weight loss or gain.   You have shortness of breath with chest pain.   You notice sudden or extreme swelling of your face, hands, ankles, feet, or legs.   You have not felt your baby move in over an hour.   You have severe headaches that do not go away with medicine.   You have vision changes.  Document Released: 04/22/2001 Document Revised: 12/29/2012 Document Reviewed: 06/29/2012  Exit[employer] Patient Information 2014 [employer].

## 2013-09-28 NOTE — Progress Notes (Signed)
Pt. Reports going to the ED 09/18/13 for severe back pain and contractions-- was told she was 0.5cm dilated and 60% effaced. Has continued to have irregular contractions since then; denies increase in intensity, pain or duration. Would like to be checked today.

## 2013-09-28 NOTE — Progress Notes (Signed)
Doing well. Some B-H. Good FM. Reviewed to come to MAU if concerns or labor check. Plans reviewed.

## 2013-10-05 ENCOUNTER — Encounter: Payer: Self-pay | Admitting: *Deleted

## 2013-10-06 ENCOUNTER — Observation Stay: Payer: Self-pay | Admitting: Obstetrics and Gynecology

## 2013-10-06 LAB — CBC WITH DIFFERENTIAL/PLATELET
Basophil #: 0 10*3/uL (ref 0.0–0.1)
Basophil %: 0.3 %
EOS ABS: 0 10*3/uL (ref 0.0–0.7)
EOS PCT: 0.1 %
HCT: 29.4 % — ABNORMAL LOW (ref 35.0–47.0)
HGB: 9.5 g/dL — ABNORMAL LOW (ref 12.0–16.0)
LYMPHS PCT: 17.5 %
Lymphocyte #: 2.1 10*3/uL (ref 1.0–3.6)
MCH: 28.3 pg (ref 26.0–34.0)
MCHC: 32.2 g/dL (ref 32.0–36.0)
MCV: 88 fL (ref 80–100)
MONOS PCT: 6.2 %
Monocyte #: 0.8 x10 3/mm (ref 0.2–0.9)
NEUTROS ABS: 9.2 10*3/uL — AB (ref 1.4–6.5)
NEUTROS PCT: 75.9 %
Platelet: 162 10*3/uL (ref 150–440)
RBC: 3.35 10*6/uL — ABNORMAL LOW (ref 3.80–5.20)
RDW: 13.1 % (ref 11.5–14.5)
WBC: 12.1 10*3/uL — ABNORMAL HIGH (ref 3.6–11.0)

## 2013-10-06 LAB — PROTIME-INR
INR: 1.1
PROTHROMBIN TIME: 13.7 s (ref 11.5–14.7)

## 2013-10-06 LAB — APTT: ACTIVATED PTT: 26.9 s (ref 23.6–35.9)
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
## 2013-10-13 ENCOUNTER — Telehealth: Payer: Self-pay

## 2013-10-13 ENCOUNTER — Ambulatory Visit (INDEPENDENT_AMBULATORY_CARE_PROVIDER_SITE_OTHER): Payer: Self-pay | Admitting: Advanced Practice Midwife

## 2013-10-13 VITALS — BP 120/82 | HR 110 | Wt 151.6 lb

## 2013-10-13 DIAGNOSIS — O0990 Supervision of high risk pregnancy, unspecified, unspecified trimester: Secondary | ICD-10-CM

## 2013-10-13 DIAGNOSIS — O26839 Pregnancy related renal disease, unspecified trimester: Secondary | ICD-10-CM

## 2013-10-13 LAB — POCT URINALYSIS DIP (DEVICE)
Bilirubin Urine: NEGATIVE
Glucose, UA: NEGATIVE mg/dL
HGB URINE DIPSTICK: NEGATIVE
Ketones, ur: NEGATIVE mg/dL
Leukocytes, UA: NEGATIVE
Nitrite: NEGATIVE
PH: 7 (ref 5.0–8.0)
PROTEIN: NEGATIVE mg/dL
SPECIFIC GRAVITY, URINE: 1.02 (ref 1.005–1.030)
UROBILINOGEN UA: 0.2 mg/dL (ref 0.0–1.0)

## 2013-10-13 MED ORDER — OXYCODONE-ACETAMINOPHEN 5-325 MG PO TABS: 1.0000 | ORAL_TABLET | Freq: Four times a day (QID) | ORAL | Status: DC | PRN
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
## 2013-10-13 NOTE — Telephone Encounter (Signed)
Called pt and left message with pt stating that the Rx that she was stating was going to be faxed can not be faxed due to the medication being a narcotic to call the clinics. Pt called the front desk and Erie Noe informed her that she will need to come to the office to pick up.  Pt agreed.

## 2013-10-15 LAB — GC/CHLAMYDIA PROBE AMP
CT Probe RNA: NEGATIVE
GC Probe RNA: NEGATIVE

## 2013-10-15 LAB — CULTURE, BETA STREP (GROUP B ONLY)

## 2013-10-17 ENCOUNTER — Encounter: Payer: Self-pay | Admitting: Family

## 2013-10-17 ENCOUNTER — Encounter: Payer: Self-pay | Admitting: Advanced Practice Midwife

## 2013-10-17 ENCOUNTER — Ambulatory Visit (INDEPENDENT_AMBULATORY_CARE_PROVIDER_SITE_OTHER): Payer: Self-pay | Admitting: Family

## 2013-10-17 VITALS — BP 112/60 | Wt 153.1 lb

## 2013-10-17 DIAGNOSIS — O99820 Streptococcus B carrier state complicating pregnancy: Secondary | ICD-10-CM

## 2013-10-17 DIAGNOSIS — Z22330 Carrier of Group B streptococcus: Secondary | ICD-10-CM

## 2013-10-17 DIAGNOSIS — O09899 Supervision of other high risk pregnancies, unspecified trimester: Secondary | ICD-10-CM

## 2013-10-17 DIAGNOSIS — O0990 Supervision of high risk pregnancy, unspecified, unspecified trimester: Secondary | ICD-10-CM

## 2013-10-17 LAB — POCT URINALYSIS DIP (DEVICE)
Bilirubin Urine: NEGATIVE
Glucose, UA: NEGATIVE mg/dL
Hgb urine dipstick: NEGATIVE
Ketones, ur: NEGATIVE mg/dL
NITRITE: NEGATIVE
PH: 7 (ref 5.0–8.0)
Protein, ur: NEGATIVE mg/dL
Specific Gravity, Urine: 1.015 (ref 1.005–1.030)
UROBILINOGEN UA: 0.2 mg/dL (ref 0.0–1.0)

## 2013-10-17 NOTE — Progress Notes (Signed)
Reviewed GBS results and need for antibiotics.  No questions or concerns.

## 2013-10-24 ENCOUNTER — Ambulatory Visit (INDEPENDENT_AMBULATORY_CARE_PROVIDER_SITE_OTHER): Payer: Self-pay | Admitting: Family Medicine

## 2013-10-24 VITALS — BP 113/65 | HR 77 | Wt 155.7 lb

## 2013-10-24 DIAGNOSIS — Z3490 Encounter for supervision of normal pregnancy, unspecified, unspecified trimester: Secondary | ICD-10-CM

## 2013-10-24 DIAGNOSIS — Z3480 Encounter for supervision of other normal pregnancy, unspecified trimester: Secondary | ICD-10-CM

## 2013-10-24 LAB — POCT URINALYSIS DIP (DEVICE)
Bilirubin Urine: NEGATIVE
Glucose, UA: NEGATIVE mg/dL
Hgb urine dipstick: NEGATIVE
Ketones, ur: NEGATIVE mg/dL
LEUKOCYTES UA: NEGATIVE
Nitrite: NEGATIVE
PH: 8.5 — AB (ref 5.0–8.0)
PROTEIN: NEGATIVE mg/dL
Specific Gravity, Urine: 1.01 (ref 1.005–1.030)
Urobilinogen, UA: 0.2 mg/dL (ref 0.0–1.0)

## 2013-10-24 NOTE — Progress Notes (Signed)
Reports intermittent pelvic pressure and contractions.  

## 2013-10-24 NOTE — Progress Notes (Signed)
+  FM, no lof, no vb, no ctx  Drue SecondKayla Schreckengost is a 23 y.o. G2P1001 at [id number] by R=6 here for ROB visit.    Discussed with Patient:  - Plans to breast/bottle feed.  All questions answered. - Continue prenatal vitamins. - Reviewed fetal kick counts Pt to perform daily at a time when the baby is active, lie laterally with both hands on belly in quiet room and count all movements (hiccups, shoulder rolls, obvious kicks, etc); pt is to report to clinic MAU for less than 10 movements felt in a 2 hour time period-pt told as soon as she counts 10 movements the count is complete.  - Routine precautions discussed (depression, infection s/s).   Patient provided with all pertinent phone numbers for emergencies. - RTC for any VB, regular, painful cramps/ctxs occurring at a rate of >2/10 min, fever (100.5 or higher), n/v/d, any pain that is unresolving or worsening, LOF, decreased fetal movement, CP, SOB, edema -RTC in one week for next visit.  Problems: Patient Active Problem List   Diagnosis Date Noted  . GBS (group B Streptococcus carrier), +RV culture, currently pregnant 10/17/2013  . Pregnancy with nephrolithiasis 06/15/2013  . Supervision of high-risk pregnancy 05/24/2013    To Do: 1.   [ ]  BCM: nexplanon [ ]  Readiness: baby has a place to sleep, car seat, other baby necessities.  Edu: [x ] PTL precautions; [ ]  BF class; [ ]  childbirth class; [ ]   BF counseling;

## 2013-10-24 NOTE — Patient Instructions (Signed)
Third Trimester of Pregnancy  The third trimester is from week 29 through week 42, months 7 through 9. The third trimester is a time when the fetus is growing rapidly. At the end of the ninth month, the fetus is about 20 inches in length and weighs 6 10 pounds.   BODY CHANGES  Your body goes through many changes during pregnancy. The changes vary from woman to woman.    Your weight will continue to increase. You can expect to gain 25 35 pounds (11 16 kg) by the end of the pregnancy.   You may begin to get stretch marks on your hips, abdomen, and breasts.   You may urinate more often because the fetus is moving lower into your pelvis and pressing on your bladder.   You may develop or continue to have heartburn as a result of your pregnancy.   You may develop constipation because certain hormones are causing the muscles that push waste through your intestines to slow down.   You may develop hemorrhoids or swollen, bulging veins (varicose veins).   You may have pelvic pain because of the weight gain and pregnancy hormones relaxing your joints between the bones in your pelvis. Back aches may result from over exertion of the muscles supporting your posture.   Your breasts will continue to grow and be tender. A yellow discharge may leak from your breasts called colostrum.   Your belly button may stick out.   You may feel short of breath because of your expanding uterus.   You may notice the fetus "dropping," or moving lower in your abdomen.   You may have a bloody mucus discharge. This usually occurs a few days to a week before labor begins.   Your cervix becomes thin and soft (effaced) near your due date.  WHAT TO EXPECT AT YOUR PRENATAL EXAMS   You will have prenatal exams every 2 weeks until week 36. Then, you will have weekly prenatal exams. During a routine prenatal visit:   You will be weighed to make sure you and the fetus are growing normally.   Your blood pressure is taken.   Your abdomen will be  measured to track your baby's growth.   The fetal heartbeat will be listened to.   Any test results from the previous visit will be discussed.   You may have a cervical check near your due date to see if you have effaced.  At around 36 weeks, your caregiver will check your cervix. At the same time, your caregiver will also perform a test on the secretions of the vaginal tissue. This test is to determine if a type of bacteria, Group B streptococcus, is present. Your caregiver will explain this further.  Your caregiver may ask you:   What your birth plan is.   How you are feeling.   If you are feeling the baby move.   If you have had any abnormal symptoms, such as leaking fluid, bleeding, severe headaches, or abdominal cramping.   If you have any questions.  Other tests or screenings that may be performed during your third trimester include:   Blood tests that check for low iron levels (anemia).   Fetal testing to check the health, activity level, and growth of the fetus. Testing is done if you have certain medical conditions or if there are problems during the pregnancy.  FALSE LABOR  You may feel small, irregular contractions that eventually go away. These are called Braxton Hicks contractions, or   false labor. Contractions may last for hours, days, or even weeks before true labor sets in. If contractions come at regular intervals, intensify, or become painful, it is best to be seen by your caregiver.   SIGNS OF LABOR    Menstrual-like cramps.   Contractions that are 5 minutes apart or less.   Contractions that start on the top of the uterus and spread down to the lower abdomen and back.   A sense of increased pelvic pressure or back pain.   A watery or bloody mucus discharge that comes from the vagina.  If you have any of these signs before the 37th week of pregnancy, call your caregiver right away. You need to go to the hospital to get checked immediately.  HOME CARE INSTRUCTIONS    Avoid all  smoking, herbs, alcohol, and unprescribed drugs. These chemicals affect the formation and growth of the baby.   Follow your caregiver's instructions regarding medicine use. There are medicines that are either safe or unsafe to take during pregnancy.   Exercise only as directed by your caregiver. Experiencing uterine cramps is a good sign to stop exercising.   Continue to eat regular, healthy meals.   Wear a good support bra for breast tenderness.   Do not use hot tubs, steam rooms, or saunas.   Wear your seat belt at all times when driving.   Avoid raw meat, uncooked cheese, cat litter boxes, and soil used by cats. These carry germs that can cause birth defects in the baby.   Take your prenatal vitamins.   Try taking a stool softener (if your caregiver approves) if you develop constipation. Eat more high-fiber foods, such as fresh vegetables or fruit and whole grains. Drink plenty of fluids to keep your urine clear or pale yellow.   Take warm sitz baths to soothe any pain or discomfort caused by hemorrhoids. Use hemorrhoid cream if your caregiver approves.   If you develop varicose veins, wear support hose. Elevate your feet for 15 minutes, 3 4 times a day. Limit salt in your diet.   Avoid heavy lifting, wear low heal shoes, and practice good posture.   Rest a lot with your legs elevated if you have leg cramps or low back pain.   Visit your dentist if you have not gone during your pregnancy. Use a soft toothbrush to brush your teeth and be gentle when you floss.   A sexual relationship may be continued unless your caregiver directs you otherwise.   Do not travel far distances unless it is absolutely necessary and only with the approval of your caregiver.   Take prenatal classes to understand, practice, and ask questions about the labor and delivery.   Make a trial run to the hospital.   Pack your hospital bag.   Prepare the baby's nursery.   Continue to go to all your prenatal visits as directed  by your caregiver.  SEEK MEDICAL CARE IF:   You are unsure if you are in labor or if your water has broken.   You have dizziness.   You have mild pelvic cramps, pelvic pressure, or nagging pain in your abdominal area.   You have persistent nausea, vomiting, or diarrhea.   You have a bad smelling vaginal discharge.   You have pain with urination.  SEEK IMMEDIATE MEDICAL CARE IF:    You have a fever.   You are leaking fluid from your vagina.   You have spotting or bleeding from your vagina.     You have severe abdominal cramping or pain.   You have rapid weight loss or gain.   You have shortness of breath with chest pain.   You notice sudden or extreme swelling of your face, hands, ankles, feet, or legs.   You have not felt your baby move in over an hour.   You have severe headaches that do not go away with medicine.   You have vision changes.  Document Released: 04/22/2001 Document Revised: 12/29/2012 Document Reviewed: 06/29/2012  Exit[employer] Patient Information 2014 [employer].

## 2013-10-31 ENCOUNTER — Encounter: Payer: Medicaid Other | Admitting: Obstetrics & Gynecology

## 2013-10-31 ENCOUNTER — Encounter (HOSPITAL_COMMUNITY): Payer: Self-pay

## 2013-10-31 ENCOUNTER — Ambulatory Visit (INDEPENDENT_AMBULATORY_CARE_PROVIDER_SITE_OTHER): Payer: Self-pay | Admitting: Obstetrics & Gynecology

## 2013-10-31 VITALS — BP 118/75 | HR 95 | Wt 155.9 lb

## 2013-10-31 DIAGNOSIS — O0990 Supervision of high risk pregnancy, unspecified, unspecified trimester: Secondary | ICD-10-CM

## 2013-10-31 DIAGNOSIS — O0993 Supervision of high risk pregnancy, unspecified, third trimester: Secondary | ICD-10-CM

## 2013-10-31 LAB — POCT URINALYSIS DIP (DEVICE)
Bilirubin Urine: NEGATIVE
Glucose, UA: NEGATIVE mg/dL
Hgb urine dipstick: NEGATIVE
Ketones, ur: NEGATIVE mg/dL
Leukocytes, UA: NEGATIVE
Nitrite: NEGATIVE
PROTEIN: NEGATIVE mg/dL
SPECIFIC GRAVITY, URINE: 1.015 (ref 1.005–1.030)
UROBILINOGEN UA: 0.2 mg/dL (ref 0.0–1.0)
pH: 7 (ref 5.0–8.0)

## 2013-10-31 NOTE — Progress Notes (Signed)
Reports intermittent pelvic pressure and irregular contractions.

## 2013-10-31 NOTE — Patient Instructions (Signed)

## 2013-10-31 NOTE — Progress Notes (Signed)
Irregular contractions not bleeding or LOF.  Instructions given

## 2013-11-03 ENCOUNTER — Ambulatory Visit (INDEPENDENT_AMBULATORY_CARE_PROVIDER_SITE_OTHER): Payer: Self-pay | Admitting: Obstetrics & Gynecology

## 2013-11-03 VITALS — BP 127/81 | HR 95 | Temp 97.1°F | Wt 154.5 lb

## 2013-11-03 DIAGNOSIS — O480 Post-term pregnancy: Secondary | ICD-10-CM

## 2013-11-03 DIAGNOSIS — O99820 Streptococcus B carrier state complicating pregnancy: Secondary | ICD-10-CM

## 2013-11-03 DIAGNOSIS — O0993 Supervision of high risk pregnancy, unspecified, third trimester: Secondary | ICD-10-CM

## 2013-11-03 DIAGNOSIS — O09899 Supervision of other high risk pregnancies, unspecified trimester: Secondary | ICD-10-CM

## 2013-11-03 DIAGNOSIS — Z22330 Carrier of Group B streptococcus: Secondary | ICD-10-CM

## 2013-11-03 DIAGNOSIS — O0990 Supervision of high risk pregnancy, unspecified, unspecified trimester: Secondary | ICD-10-CM

## 2013-11-03 LAB — POCT URINALYSIS DIP (DEVICE)
Bilirubin Urine: NEGATIVE
Glucose, UA: NEGATIVE mg/dL
HGB URINE DIPSTICK: NEGATIVE
Ketones, ur: NEGATIVE mg/dL
NITRITE: NEGATIVE
PH: 7.5 (ref 5.0–8.0)
Protein, ur: NEGATIVE mg/dL
Specific Gravity, Urine: 1.015 (ref 1.005–1.030)
UROBILINOGEN UA: 0.2 mg/dL (ref 0.0–1.0)

## 2013-11-03 LAB — US OB FOLLOW UP

## 2013-11-03 NOTE — Progress Notes (Signed)
Patient reports pelvic pressure and being unable to stand or walk at times due to pressure from baby

## 2013-11-03 NOTE — Patient Instructions (Signed)
Return to clinic for any obstetric concerns or go to MAU for evaluation  

## 2013-11-03 NOTE — Progress Notes (Signed)
More cervical effacement noted, patient reassured.  Postdates testing done today; BPP 10/10, AFI normal at 12.7 cm.  IOL scheduled 11/08/13.   No other complaints or concerns.  Fetal movement and labor precautions reviewed.

## 2013-11-03 NOTE — Progress Notes (Signed)
IOL scheduled 6/30 @ 0700, labor precautions given.

## 2013-11-04 ENCOUNTER — Inpatient Hospital Stay (HOSPITAL_COMMUNITY)
Admission: AD | Admit: 2013-11-04 | Discharge: 2013-11-04 | Disposition: A | Discharge status: Home / Self Care | Payer: Medicaid Other | Source: Ambulatory Visit | Attending: Obstetrics & Gynecology | Admitting: Obstetrics & Gynecology

## 2013-11-04 ENCOUNTER — Inpatient Hospital Stay: Payer: Self-pay | Admitting: Obstetrics & Gynecology

## 2013-11-04 ENCOUNTER — Encounter (HOSPITAL_COMMUNITY): Payer: Self-pay | Admitting: *Deleted

## 2013-11-04 ENCOUNTER — Telehealth (HOSPITAL_COMMUNITY): Payer: Self-pay | Admitting: *Deleted

## 2013-11-04 DIAGNOSIS — O99820 Streptococcus B carrier state complicating pregnancy: Secondary | ICD-10-CM

## 2013-11-04 DIAGNOSIS — O0993 Supervision of high risk pregnancy, unspecified, third trimester: Secondary | ICD-10-CM

## 2013-11-04 DIAGNOSIS — O479 False labor, unspecified: Secondary | ICD-10-CM | POA: Insufficient documentation

## 2013-11-04 DIAGNOSIS — O26833 Pregnancy related renal disease, third trimester: Secondary | ICD-10-CM

## 2013-11-04 DIAGNOSIS — N200 Calculus of kidney: Secondary | ICD-10-CM

## 2013-11-04 LAB — CBC WITH DIFFERENTIAL/PLATELET
Basophil #: 0.1 10*3/uL (ref 0.0–0.1)
Basophil %: 0.3 %
EOS ABS: 0 10*3/uL (ref 0.0–0.7)
Eosinophil %: 0 %
HCT: 32.2 % — ABNORMAL LOW (ref 35.0–47.0)
HGB: 10.4 g/dL — ABNORMAL LOW (ref 12.0–16.0)
Lymphocyte #: 1.5 10*3/uL (ref 1.0–3.6)
Lymphocyte %: 6.4 %
MCH: 27.5 pg (ref 26.0–34.0)
MCHC: 32.4 g/dL (ref 32.0–36.0)
MCV: 85 fL (ref 80–100)
MONO ABS: 1 x10 3/mm — AB (ref 0.2–0.9)
MONOS PCT: 4.2 %
Neutrophil #: 20.8 10*3/uL — ABNORMAL HIGH (ref 1.4–6.5)
Neutrophil %: 89.1 %
PLATELETS: 181 10*3/uL (ref 150–440)
RBC: 3.79 10*6/uL — ABNORMAL LOW (ref 3.80–5.20)
RDW: 14.4 % (ref 11.5–14.5)
WBC: 23.3 10*3/uL — AB (ref 3.6–11.0)

## 2013-11-04 MED ORDER — NALBUPHINE HCL 10 MG/ML IJ SOLN
10.0000 mg | Freq: Once | INTRAMUSCULAR | Status: AC
  Administered 2013-11-04: 10 mg via INTRAVENOUS
  Filled 2013-11-04: qty 1

## 2013-11-04 NOTE — Discharge Instructions (Signed)

## 2013-11-04 NOTE — MAU Note (Signed)
Returning to MAU for labor eval

## 2013-11-04 NOTE — MAU Note (Signed)
"  it's really a 10 now, feeling pressure"

## 2013-11-04 NOTE — Discharge Instructions (Signed)
Third Trimester of Pregnancy The third trimester is from week 29 through week 42, months 7 through 9. The third trimester is a time when the fetus is growing rapidly. At the end of the ninth month, the fetus is about 20 inches in length and weighs 6-10 pounds.  BODY CHANGES Your body goes through many changes during pregnancy. The changes vary from woman to woman.   Your weight will continue to increase. You can expect to gain 25-35 pounds (11-16 kg) by the end of the pregnancy.  You may begin to get stretch marks on your hips, abdomen, and breasts.  You may urinate more often because the fetus is moving lower into your pelvis and pressing on your bladder.  You may develop or continue to have heartburn as a result of your pregnancy.  You may develop constipation because certain hormones are causing the muscles that push waste through your intestines to slow down.  You may develop hemorrhoids or swollen, bulging veins (varicose veins).  You may have pelvic pain because of the weight gain and pregnancy hormones relaxing your joints between the bones in your pelvis. Backaches may result from overexertion of the muscles supporting your posture.  You may have changes in your hair. These can include thickening of your hair, rapid growth, and changes in texture. Some women also have hair loss during or after pregnancy, or hair that feels dry or thin. Your hair will most likely return to normal after your baby is born.  Your breasts will continue to grow and be tender. A yellow discharge may leak from your breasts called colostrum.  Your belly button may stick out.  You may feel short of breath because of your expanding uterus.  You may notice the fetus "dropping," or moving lower in your abdomen.  You may have a bloody mucus discharge. This usually occurs a few days to a week before labor begins.  Your cervix becomes thin and soft (effaced) near your due date. WHAT TO EXPECT AT YOUR PRENATAL  EXAMS  You will have prenatal exams every 2 weeks until week 36. Then, you will have weekly prenatal exams. During a routine prenatal visit:  You will be weighed to make sure you and the fetus are growing normally.  Your blood pressure is taken.  Your abdomen will be measured to track your baby's growth.  The fetal heartbeat will be listened to.  Any test results from the previous visit will be discussed.  You may have a cervical check near your due date to see if you have effaced. At around 36 weeks, your caregiver will check your cervix. At the same time, your caregiver will also perform a test on the secretions of the vaginal tissue. This test is to determine if a type of bacteria, Group B streptococcus, is present. Your caregiver will explain this further. Your caregiver may ask you:  What your birth plan is.  How you are feeling.  If you are feeling the baby move.  If you have had any abnormal symptoms, such as leaking fluid, bleeding, severe headaches, or abdominal cramping.  If you have any questions. Other tests or screenings that may be performed during your third trimester include:  Blood tests that check for low iron levels (anemia).  Fetal testing to check the health, activity level, and growth of the fetus. Testing is done if you have certain medical conditions or if there are problems during the pregnancy. FALSE LABOR You may feel small, irregular contractions that   eventually go away. These are called Braxton Hicks contractions, or false labor. Contractions may last for hours, days, or even weeks before true labor sets in. If contractions come at regular intervals, intensify, or become painful, it is best to be seen by your caregiver.  SIGNS OF LABOR   Menstrual-like cramps.  Contractions that are 5 minutes apart or less.  Contractions that start on the top of the uterus and spread down to the lower abdomen and back.  A sense of increased pelvic pressure or back  pain.  A watery or bloody mucus discharge that comes from the vagina. If you have any of these signs before the 37th week of pregnancy, call your caregiver right away. You need to go to the hospital to get checked immediately. HOME CARE INSTRUCTIONS   Avoid all smoking, herbs, alcohol, and unprescribed drugs. These chemicals affect the formation and growth of the baby.  Follow your caregiver's instructions regarding medicine use. There are medicines that are either safe or unsafe to take during pregnancy.  Exercise only as directed by your caregiver. Experiencing uterine cramps is a good sign to stop exercising.  Continue to eat regular, healthy meals.  Wear a good support bra for breast tenderness.  Do not use hot tubs, steam rooms, or saunas.  Wear your seat belt at all times when driving.  Avoid raw meat, uncooked cheese, cat litter boxes, and soil used by cats. These carry germs that can cause birth defects in the baby.  Take your prenatal vitamins.  Try taking a stool softener (if your caregiver approves) if you develop constipation. Eat more high-fiber foods, such as fresh vegetables or fruit and whole grains. Drink plenty of fluids to keep your urine clear or pale yellow.  Take warm sitz baths to soothe any pain or discomfort caused by hemorrhoids. Use hemorrhoid cream if your caregiver approves.  If you develop varicose veins, wear support hose. Elevate your feet for 15 minutes, 3-4 times a day. Limit salt in your diet.  Avoid heavy lifting, wear low heal shoes, and practice good posture.  Rest a lot with your legs elevated if you have leg cramps or low back pain.  Visit your dentist if you have not gone during your pregnancy. Use a soft toothbrush to brush your teeth and be gentle when you floss.  A sexual relationship may be continued unless your caregiver directs you otherwise.  Do not travel far distances unless it is absolutely necessary and only with the approval  of your caregiver.  Take prenatal classes to understand, practice, and ask questions about the labor and delivery.  Make a trial run to the hospital.  Pack your hospital bag.  Prepare the baby's nursery.  Continue to go to all your prenatal visits as directed by your caregiver. SEEK MEDICAL CARE IF:  You are unsure if you are in labor or if your water has broken.  You have dizziness.  You have mild pelvic cramps, pelvic pressure, or nagging pain in your abdominal area.  You have persistent nausea, vomiting, or diarrhea.  You have a bad smelling vaginal discharge.  You have pain with urination. SEEK IMMEDIATE MEDICAL CARE IF:   You have a fever.  You are leaking fluid from your vagina.  You have spotting or bleeding from your vagina.  You have severe abdominal cramping or pain.  You have rapid weight loss or gain.  You have shortness of breath with chest pain.  You notice sudden or extreme swelling   of your face, hands, ankles, feet, or legs.  You have not felt your baby move in over an hour.  You have severe headaches that do not go away with medicine.  You have vision changes. Document Released: 04/22/2001 Document Revised: 05/03/2013 Document Reviewed: 06/29/2012 Exit[employer] Patient Information 2015 [employer]. This information is not intended to replace advice given to you by your health care provider. Make sure you discuss any questions you have with your health care provider.  

## 2013-11-04 NOTE — MAU Note (Signed)
Pt reports contractions since this am.

## 2013-11-05 LAB — GC/CHLAMYDIA PROBE AMP

## 2013-11-06 LAB — HEMATOCRIT: HCT: 28.4 % — ABNORMAL LOW (ref 35.0–47.0)

## 2013-11-08 ENCOUNTER — Inpatient Hospital Stay (HOSPITAL_COMMUNITY): Admission: RE | Admit: 2013-11-08 | Payer: Medicaid Other | Source: Ambulatory Visit

## 2013-12-10 ENCOUNTER — Emergency Department (HOSPITAL_BASED_OUTPATIENT_CLINIC_OR_DEPARTMENT_OTHER): Payer: Medicaid Other

## 2013-12-10 ENCOUNTER — Emergency Department (HOSPITAL_BASED_OUTPATIENT_CLINIC_OR_DEPARTMENT_OTHER)
Admission: EM | Admit: 2013-12-10 | Discharge: 2013-12-10 | Disposition: A | Discharge status: Home / Self Care | Payer: Medicaid Other | Attending: Emergency Medicine | Admitting: Emergency Medicine

## 2013-12-10 ENCOUNTER — Encounter (HOSPITAL_BASED_OUTPATIENT_CLINIC_OR_DEPARTMENT_OTHER): Payer: Self-pay | Admitting: Emergency Medicine

## 2013-12-10 DIAGNOSIS — S60212A Contusion of left wrist, initial encounter: Secondary | ICD-10-CM

## 2013-12-10 DIAGNOSIS — Z79899 Other long term (current) drug therapy: Secondary | ICD-10-CM | POA: Diagnosis not present

## 2013-12-10 DIAGNOSIS — Z8709 Personal history of other diseases of the respiratory system: Secondary | ICD-10-CM | POA: Insufficient documentation

## 2013-12-10 DIAGNOSIS — Z8701 Personal history of pneumonia (recurrent): Secondary | ICD-10-CM | POA: Insufficient documentation

## 2013-12-10 DIAGNOSIS — S60219A Contusion of unspecified wrist, initial encounter: Secondary | ICD-10-CM | POA: Diagnosis not present

## 2013-12-10 DIAGNOSIS — Y9289 Other specified places as the place of occurrence of the external cause: Secondary | ICD-10-CM | POA: Diagnosis not present

## 2013-12-10 DIAGNOSIS — O9989 Other specified diseases and conditions complicating pregnancy, childbirth and the puerperium: Secondary | ICD-10-CM | POA: Diagnosis present

## 2013-12-10 DIAGNOSIS — W230XXA Caught, crushed, jammed, or pinched between moving objects, initial encounter: Secondary | ICD-10-CM | POA: Insufficient documentation

## 2013-12-10 DIAGNOSIS — Z87442 Personal history of urinary calculi: Secondary | ICD-10-CM | POA: Diagnosis not present

## 2013-12-10 DIAGNOSIS — N189 Chronic kidney disease, unspecified: Secondary | ICD-10-CM | POA: Diagnosis not present

## 2013-12-10 DIAGNOSIS — Y9389 Activity, other specified: Secondary | ICD-10-CM | POA: Diagnosis not present

## 2013-12-10 MED ORDER — OXYCODONE-ACETAMINOPHEN 5-325 MG PO TABS
2.0000 | ORAL_TABLET | Freq: Three times a day (TID) | ORAL | Status: DC | PRN
Start: 2013-12-10 — End: 2017-02-09

## 2013-12-10 NOTE — ED Notes (Signed)
Patient had her arm closed in a car door. Bruising and swelling to left wrist. States that her fingers are tingling and she can't open or close them.

## 2013-12-10 NOTE — ED Provider Notes (Signed)
CSN: [id number]     Arrival date & time 12/10/13  1628 History   First MD Initiated Contact with Patient 12/10/13 1942    This chart was scribed for No att. providers found by Marica Otter, ED Scribe. This patient was seen in room MHOTF/OTF and the patient's care was started at 7:44 PM.  No chief complaint on file.  HPI HPI Comments: Katherine Conrad is a 23 y.o. female, who is [redacted] weeks pregnant and is breastfeeding, who presents to the Emergency Department complaining of left hand pain resulting from pt closing a car door on her left arm and hand approximately 6 hours ago today. Pt rates her pain an 8 out of 10. No weak/numb. No bleeding. Pain localized without radiation worse with movement and palpation.  Past Medical History  Diagnosis Date  . Pneumonia   . Chronic kidney disease     Kidney stones  . Flu   . Renal calculi    Past Surgical History  Procedure Laterality Date  . Tonsillectomy    . Laparoscopic abdominal exploration     Family History  Problem Relation Age of Onset  . Alcohol abuse Neg Hx   . Arthritis Neg Hx   . Asthma Neg Hx   . Birth defects Neg Hx   . Cancer Neg Hx   . COPD Neg Hx   . Depression Neg Hx   . Diabetes Neg Hx   . Drug abuse Neg Hx   . Early death Neg Hx   . Hearing loss Neg Hx   . Heart disease Neg Hx   . Hyperlipidemia Neg Hx   . Hypertension Neg Hx   . Kidney disease Neg Hx   . Learning disabilities Neg Hx   . Mental illness Neg Hx   . Mental retardation Neg Hx   . Miscarriages / Stillbirths Neg Hx   . Stroke Neg Hx   . Vision loss Neg Hx   . Varicose Veins Neg Hx    History  Substance Use Topics  . Smoking status: Never Smoker   . Smokeless tobacco: Never Used  . Alcohol Use: No   OB History   Grav Para Term Preterm Abortions TAB SAB Ect Mult Living   2 1 1       1      Review of Systems See HPI   Allergies  Review of patient's allergies indicates no known allergies.  Home Medications   Prior to Admission medications    Medication Sig Start Date End Date Taking? Authorizing Provider  acetaminophen (TYLENOL) 325 MG tablet Take 650 mg by mouth daily as needed.    Historical Provider, MD  calcium carbonate (TUMS - DOSED IN MG ELEMENTAL CALCIUM) 500 MG chewable tablet Chew 2 tablets by mouth 3 (three) times daily as needed for indigestion or heartburn.    Historical Provider, MD  oxyCODONE-acetaminophen (PERCOCET) 5-325 MG per tablet Take 2 tablets by mouth every 8 (eight) hours as needed for severe pain. 12/10/13   Hurman Horn, MD  Prenatal Vit-Fe Fumarate-FA (PRENATAL MULTIVITAMIN) TABS tablet Take 1 tablet by mouth daily at 12 noon.    Historical Provider, MD   BP 111/78  Pulse 68  Temp(Src) 98.1 F (36.7 C) (Oral)  Resp 16  SpO2 100%  Breastfeeding? Unknown Physical Exam  Nursing note and vitals reviewed. Constitutional:  Awake, alert, nontoxic appearance.  HENT:  Head: Atraumatic.  Eyes: Right eye exhibits no discharge. Left eye exhibits no discharge.  Neck: Neck supple.  Pulmonary/Chest: Effort normal. She exhibits no tenderness.  Abdominal: Soft. There is no tenderness. There is no rebound.  Musculoskeletal: She exhibits tenderness.  Baseline ROM, no obvious new focal weakness. Left arm nontender shoulder, elbow and proximal forearm.  Cap refill less than 2 seconds.Intact light touch and motor function in the distribution of median, ulnar and radial nerve function. No swelling to the digits and good passive ROM all digits. Wrist diffusely tender with ecchymosis and snuff box tenderness. Doubt compartment syndrome.   Neurological:  Mental status and motor strength appears baseline for patient and situation.  Skin: No rash noted.  Psychiatric: She has a normal mood and affect.    ED Course  Procedures (including critical care time) COORDINATION OF CARE: 7:48 PM-Discussed treatment plan which includes splint placement, referral to ortho and pain meds with pt at bedside and pt agreed to plan.    Labs Review Labs Reviewed - No data to display  Imaging Review No results found.   EKG Interpretation None      MDM   Final diagnoses:  Wrist contusion, left, initial encounter   I doubt any other EMC precluding discharge at this time including, but not necessarily limited to the following:compartment syndrome.  I personally performed the services described in this documentation, which was scribed in my presence. The recorded information has been reviewed and is accurate.     Hurman HornJohn M Kyzer Blowe, MD 12/13/13 [phone]

## 2013-12-10 NOTE — Discharge Instructions (Signed)
Wrist injuries are frequent in adults and children. A sprain is an injury to the ligaments that hold your bones together. A strain is an injury to muscle or muscle tendons (cord like structure) from stretching or pulling. Generally, when wrists are moderately tender to touch following a fall or injury, a fracture (break in bone) may be present. Because of this, even if your x-rays were normal today, it is important that you receive follow-up care as suggested (you could still have a broken bone). °Keep your arm raised above the level of your heart whenever possible to reduce swelling and pain.  °Wear your splint for at least one week or until seen by a physician for a follow-up examination. °SEEK IMMEDIATE MEDICAL ATTENTION IF: °Your fingers are swollen very red, white, and cold or blue.  °Your fingers are numb or tingling.  °You have increasing pain or difficulty moving your fingers. °Remember the importance of follow-up and possible follow-up x-rays. Improvement in pain level is not 100% insurance of not having a fracture. ° °

## 2013-12-15 ENCOUNTER — Ambulatory Visit: Payer: Medicaid Other | Admitting: Obstetrics and Gynecology

## 2013-12-15 ENCOUNTER — Telehealth: Payer: Self-pay | Admitting: *Deleted

## 2013-12-15 ENCOUNTER — Encounter: Payer: Self-pay | Admitting: *Deleted

## 2013-12-15 NOTE — Telephone Encounter (Signed)
Attempted to contact patient, no answer, left message for patient to call clinic and reschedule missed appointment. Will send letter.  Letter sent. 

## 2013-12-16 ENCOUNTER — Encounter: Payer: Self-pay | Admitting: *Deleted

## 2014-02-14 ENCOUNTER — Emergency Department (HOSPITAL_BASED_OUTPATIENT_CLINIC_OR_DEPARTMENT_OTHER)
Admission: EM | Admit: 2014-02-14 | Discharge: 2014-02-14 | Disposition: A | Discharge status: Home / Self Care | Payer: Medicaid Other | Attending: Emergency Medicine | Admitting: Emergency Medicine

## 2014-02-14 ENCOUNTER — Encounter (HOSPITAL_BASED_OUTPATIENT_CLINIC_OR_DEPARTMENT_OTHER): Payer: Self-pay | Admitting: Emergency Medicine

## 2014-02-14 ENCOUNTER — Emergency Department (HOSPITAL_BASED_OUTPATIENT_CLINIC_OR_DEPARTMENT_OTHER): Payer: Medicaid Other

## 2014-02-14 DIAGNOSIS — Y9389 Activity, other specified: Secondary | ICD-10-CM | POA: Insufficient documentation

## 2014-02-14 DIAGNOSIS — Y990 Civilian activity done for income or pay: Secondary | ICD-10-CM | POA: Insufficient documentation

## 2014-02-14 DIAGNOSIS — Z8709 Personal history of other diseases of the respiratory system: Secondary | ICD-10-CM | POA: Diagnosis not present

## 2014-02-14 DIAGNOSIS — Y9289 Other specified places as the place of occurrence of the external cause: Secondary | ICD-10-CM | POA: Diagnosis not present

## 2014-02-14 DIAGNOSIS — Z8701 Personal history of pneumonia (recurrent): Secondary | ICD-10-CM | POA: Diagnosis not present

## 2014-02-14 DIAGNOSIS — W231XXA Caught, crushed, jammed, or pinched between stationary objects, initial encounter: Secondary | ICD-10-CM | POA: Insufficient documentation

## 2014-02-14 DIAGNOSIS — S63615A Unspecified sprain of left ring finger, initial encounter: Secondary | ICD-10-CM | POA: Diagnosis not present

## 2014-02-14 DIAGNOSIS — Z79899 Other long term (current) drug therapy: Secondary | ICD-10-CM | POA: Diagnosis not present

## 2014-02-14 DIAGNOSIS — N189 Chronic kidney disease, unspecified: Secondary | ICD-10-CM | POA: Diagnosis not present

## 2014-02-14 DIAGNOSIS — S60945A Unspecified superficial injury of left ring finger, initial encounter: Secondary | ICD-10-CM | POA: Diagnosis present

## 2014-02-14 DIAGNOSIS — S63617A Unspecified sprain of left little finger, initial encounter: Secondary | ICD-10-CM

## 2014-02-14 MED ORDER — HYDROCODONE-ACETAMINOPHEN 5-325 MG PO TABS: 1.0000 | ORAL_TABLET | Freq: Four times a day (QID) | ORAL | Status: DC | PRN

## 2014-02-14 NOTE — ED Provider Notes (Signed)
CSN: [id number]     Arrival date & time 02/14/14  1026 History   First MD Initiated Contact with Patient 02/14/14 1109     Chief Complaint  Patient presents with  . 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  She is having pain and swelling.  Patient is a 23 y.o. female presenting with hand injury. The history is provided by the patient.  Hand Injury Location:  Hand Time since incident:  2 hours Hand location:  L hand Pain details:    Quality:  Sharp   Radiates to:  Does not radiate   Severity:  Severe   Onset quality:  Sudden   Timing:  Constant   Progression:  Worsening Chronicity:  New   Past Medical History  Diagnosis Date  . Pneumonia   . Chronic kidney disease     Kidney stones  . Flu   . Renal calculi    Past Surgical History  Procedure Laterality Date  . Tonsillectomy    . Laparoscopic abdominal exploration     Family History  Problem Relation Age of Onset  . Alcohol abuse Neg Hx   . Arthritis Neg Hx   . Asthma Neg Hx   . Birth defects Neg Hx   . Cancer Neg Hx   . COPD Neg Hx   . Depression Neg Hx   . Diabetes Neg Hx   . Drug abuse Neg Hx   . Early death Neg Hx   . Hearing loss Neg Hx   . Heart disease Neg Hx   . Hyperlipidemia Neg Hx   . Hypertension Neg Hx   . Kidney disease Neg Hx   . Learning disabilities Neg Hx   . Mental illness Neg Hx   . Mental retardation Neg Hx   . Miscarriages / Stillbirths Neg Hx   . Stroke Neg Hx   . Vision loss Neg Hx   . Varicose Veins Neg Hx    History  Substance Use Topics  . Smoking status: Never Smoker   . Smokeless tobacco: Never Used  . Alcohol Use: No   OB History   Grav Para Term Preterm Abortions TAB SAB Ect Mult Living   2 1 1       1      Review of Systems  All other systems  reviewed and are negative.     Allergies  Review of patient's allergies indicates no known allergies.  Home Medications   Prior to Admission medications   Medication Sig Start Date End Date Taking? Authorizing Provider  acetaminophen (TYLENOL) 325 MG tablet Take 650 mg by mouth daily as needed.    Historical Provider, MD  calcium carbonate (TUMS - DOSED IN MG ELEMENTAL CALCIUM) 500 MG chewable tablet Chew 2 tablets by mouth 3 (three) times daily as needed for indigestion or heartburn.    Historical Provider, MD  HYDROcodone-acetaminophen (NORCO) 5-325 MG per tablet Take 1-2 tablets by mouth every 6 (six) hours as needed. 02/14/14   Geoffery Lyons, MD  oxyCODONE-acetaminophen (PERCOCET) 5-325 MG per tablet Take 2 tablets by mouth every 8 (eight) hours as needed for severe pain. 12/10/13   Hurman Horn, MD  Prenatal Vit-Fe Fumarate-FA (PRENATAL MULTIVITAMIN) TABS tablet Take 1 tablet by mouth daily at 12 noon.    Historical Provider, MD  BP 118/69  Pulse 84  Temp(Src) 98.7 F (37.1 C) (Oral)  Resp 18  Ht 5\' 6"  (1.676 m)  Wt 130 lb (58.968 kg)  BMI 20.99 kg/m2  SpO2 100%  Breastfeeding? No Physical Exam  Nursing note and vitals reviewed. Constitutional: She is oriented to person, place, and time. She appears well-developed and well-nourished. No distress.  HENT:  Head: Normocephalic and atraumatic.  Neck: Normal range of motion. Neck supple.  Cardiovascular: Normal rate and regular rhythm.   Pulmonary/Chest: Effort normal. No respiratory distress.  Musculoskeletal: Normal range of motion.  The left 5th finger is noted to have swelling and ecchymosis present.  She expresses an inability to extend the finger.  Cap refill and sensation intact.  Neurological: She is alert and oriented to person, place, and time.  Skin: Skin is warm and dry. She is not diaphoretic.    ED Course  Procedures (including critical care time) Labs Review Labs Reviewed - No data to display  Imaging  Review Dg Finger Little Left  02/14/2014   CLINICAL DATA:  Trauma left fifth digit. Finger caught in a belt. Bruising and swelling. Initial visit.  EXAM: LEFT LITTLE FINGER 2+V  COMPARISON:  No prior.  FINDINGS: Soft tissue swelling is present. No acute bony or joint abnormality identified. No evidence of fracture or dislocation. No foreign body.  IMPRESSION: Soft tissue swelling.  No acute bony abnormality.   Electronically Signed   By: Maisie Fushomas  Register   On: 02/14/2014 11:00     EKG Interpretation None      MDM   Final diagnoses:  Sprain of fifth finger of left hand, initial encounter    Xrays negative for fracture.  As there is an inability to extend her finger, I am concerned about the possibility of a tendon injury.  She will be placed in a splint and recommendations for follow up with hand surgery are indicated.  She is to call to schedule this.      Geoffery Lyonsouglas Adreanne Yono, MD 02/14/14 [phone]

## 2014-02-14 NOTE — Discharge Instructions (Signed)
Call Dr. Carlos LeveringGramig's office to arrange a followup appointment. The contact information has been provided in this discharge summary.  Wear splint as applied until followup with hand surgery.   Finger Sprain A finger sprain is a tear in one of the strong, fibrous tissues that connect the bones (ligaments) in your finger. The severity of the sprain depends on how much of the ligament is torn. The tear can be either partial or complete. CAUSES  Often, sprains are a result of a fall or accident. If you extend your hands to catch an object or to protect yourself, the force of the impact causes the fibers of your ligament to stretch too much. This excess tension causes the fibers of your ligament to tear. SYMPTOMS  You may have some loss of motion in your finger. Other symptoms include:  Bruising.  Tenderness.  Swelling. DIAGNOSIS  In order to diagnose finger sprain, your caregiver will physically examine your finger or thumb to determine how torn the ligament is. Your caregiver may also suggest an X-ray exam of your finger to make sure no bones are broken. TREATMENT  If your ligament is only partially torn, treatment usually involves keeping the finger in a fixed position (immobilization) for a short period. To do this, your caregiver will apply a bandage, cast, or splint to keep your finger from moving until it heals. For a partially torn ligament, the healing process usually takes 2 to 3 weeks. If your ligament is completely torn, you may need surgery to reconnect the ligament to the bone. After surgery a cast or splint will be applied and will need to stay on your finger or thumb for 4 to 6 weeks while your ligament heals. HOME CARE INSTRUCTIONS  Keep your injured finger elevated, when possible, to decrease swelling.  To ease pain and swelling, apply ice to your joint twice a day, for 2 to 3 days:  Put ice in a plastic bag.  Place a towel between your skin and the bag.  Leave the ice on for  15 minutes.  Only take over-the-counter or prescription medicine for pain as directed by your caregiver.  Do not wear rings on your injured finger.  Do not leave your finger unprotected until pain and stiffness go away (usually 3 to 4 weeks).  Do not allow your cast or splint to get wet. Cover your cast or splint with a plastic bag when you shower or bathe. Do not swim.  Your caregiver may suggest special exercises for you to do during your recovery to prevent or limit permanent stiffness. SEEK IMMEDIATE MEDICAL CARE IF:  Your cast or splint becomes damaged.  Your pain becomes worse rather than better. MAKE SURE YOU:  Understand these instructions.  Will watch your condition.  Will get help right away if you are not doing well or get worse. Document Released: 06/05/2004 Document Revised: 07/21/2011 Document Reviewed: 12/30/2010 [facility] Patient Information 2015 [employer]. This information is not intended to replace advice given to you by your health care provider. Make sure you discuss any questions you have with your health care provider.

## 2014-02-14 NOTE — ED Notes (Signed)
Pt to Ed co of left fifth digit injury. 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 Pt got the finger caught in the loop of the belt. Pt presents with obvious deformity, bruising and swelling to the finger. Pt took 400 mg of ibuprofen at 0815 am. Pt has no medical Hx.

## 2014-03-06 ENCOUNTER — Encounter (HOSPITAL_COMMUNITY): Payer: Self-pay | Admitting: Emergency Medicine

## 2014-03-06 ENCOUNTER — Emergency Department (HOSPITAL_COMMUNITY)
Admission: EM | Admit: 2014-03-06 | Discharge: 2014-03-07 | Disposition: A | Discharge status: Home / Self Care | Payer: Medicaid Other | Source: Home / Self Care | Attending: Emergency Medicine | Admitting: Emergency Medicine

## 2014-03-06 ENCOUNTER — Encounter (HOSPITAL_BASED_OUTPATIENT_CLINIC_OR_DEPARTMENT_OTHER): Payer: Self-pay | Admitting: Emergency Medicine

## 2014-03-06 ENCOUNTER — Emergency Department (HOSPITAL_BASED_OUTPATIENT_CLINIC_OR_DEPARTMENT_OTHER)
Admission: EM | Admit: 2014-03-06 | Discharge: 2014-03-06 | Disposition: A | Discharge status: Home / Self Care | Payer: Medicaid Other | Attending: Emergency Medicine | Admitting: Emergency Medicine

## 2014-03-06 ENCOUNTER — Emergency Department (HOSPITAL_BASED_OUTPATIENT_CLINIC_OR_DEPARTMENT_OTHER)
Admission: EM | Admit: 2014-03-06 | Discharge: 2014-03-06 | Disposition: A | Discharge status: Home / Self Care | Payer: Medicaid Other | Source: Home / Self Care

## 2014-03-06 DIAGNOSIS — Z79899 Other long term (current) drug therapy: Secondary | ICD-10-CM | POA: Insufficient documentation

## 2014-03-06 DIAGNOSIS — Y929 Unspecified place or not applicable: Secondary | ICD-10-CM | POA: Diagnosis not present

## 2014-03-06 DIAGNOSIS — IMO0002 Reserved for concepts with insufficient information to code with codable children: Secondary | ICD-10-CM

## 2014-03-06 DIAGNOSIS — Y9389 Activity, other specified: Secondary | ICD-10-CM | POA: Diagnosis not present

## 2014-03-06 DIAGNOSIS — N189 Chronic kidney disease, unspecified: Secondary | ICD-10-CM

## 2014-03-06 DIAGNOSIS — Y288XXA Contact with other sharp object, undetermined intent, initial encounter: Secondary | ICD-10-CM | POA: Diagnosis not present

## 2014-03-06 DIAGNOSIS — Z87442 Personal history of urinary calculi: Secondary | ICD-10-CM | POA: Insufficient documentation

## 2014-03-06 DIAGNOSIS — Z8709 Personal history of other diseases of the respiratory system: Secondary | ICD-10-CM | POA: Insufficient documentation

## 2014-03-06 DIAGNOSIS — Z8701 Personal history of pneumonia (recurrent): Secondary | ICD-10-CM | POA: Diagnosis not present

## 2014-03-06 DIAGNOSIS — S51812A Laceration without foreign body of left forearm, initial encounter: Secondary | ICD-10-CM | POA: Diagnosis present

## 2014-03-06 DIAGNOSIS — Z4801 Encounter for change or removal of surgical wound dressing: Secondary | ICD-10-CM

## 2014-03-06 MED ORDER — LIDOCAINE-EPINEPHRINE (PF) 2 %-1:200000 IJ SOLN
10.0000 mL | Freq: Once | INTRAMUSCULAR | Status: AC
  Administered 2014-03-07: 10 mL
  Filled 2014-03-06: qty 20

## 2014-03-06 MED ORDER — LIDOCAINE-EPINEPHRINE (PF) 2 %-1:200000 IJ SOLN
10.0000 mL | Freq: Once | INTRAMUSCULAR | Status: AC
  Administered 2014-03-06: 10 mL
  Filled 2014-03-06: qty 20

## 2014-03-06 MED ORDER — HYDROCODONE-ACETAMINOPHEN 5-325 MG PO TABS
1.0000 | ORAL_TABLET | Freq: Four times a day (QID) | ORAL | Status: DC | PRN
Start: 2014-03-06 — End: 2014-04-08
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
## 2014-03-06 NOTE — ED Notes (Signed)
Pt and family departing at this time unwilling to discuss further care

## 2014-03-06 NOTE — ED Notes (Signed)
Ice pack and note for work given- directed to pharmacy to pick up prescriptions

## 2014-03-06 NOTE — ED Notes (Signed)
PT reports she pulled out the sutures on Lt posterior forearm with a fork tonight.
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
## 2014-03-06 NOTE — ED Notes (Signed)
Pt reports was doing dishes and fork got caught in stitches and pulled stitches out in left forearm that she had placed today from an injury at work.
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
## 2014-03-07 ENCOUNTER — Emergency Department (HOSPITAL_BASED_OUTPATIENT_CLINIC_OR_DEPARTMENT_OTHER)
Admission: EM | Admit: 2014-03-07 | Discharge: 2014-03-07 | Disposition: A | Discharge status: Home / Self Care | Payer: Medicaid Other | Source: Home / Self Care | Attending: Emergency Medicine | Admitting: Emergency Medicine

## 2014-03-07 ENCOUNTER — Encounter (HOSPITAL_BASED_OUTPATIENT_CLINIC_OR_DEPARTMENT_OTHER): Payer: Self-pay | Admitting: Emergency Medicine

## 2014-03-07 DIAGNOSIS — N189 Chronic kidney disease, unspecified: Secondary | ICD-10-CM | POA: Insufficient documentation

## 2014-03-07 DIAGNOSIS — Z8709 Personal history of other diseases of the respiratory system: Secondary | ICD-10-CM | POA: Insufficient documentation

## 2014-03-07 DIAGNOSIS — Z79899 Other long term (current) drug therapy: Secondary | ICD-10-CM | POA: Insufficient documentation

## 2014-03-07 DIAGNOSIS — Z8701 Personal history of pneumonia (recurrent): Secondary | ICD-10-CM

## 2014-03-07 DIAGNOSIS — Y838 Other surgical procedures as the cause of abnormal reaction of the patient, or of later complication, without mention of misadventure at the time of the procedure: Secondary | ICD-10-CM

## 2014-03-07 DIAGNOSIS — Z791 Long term (current) use of non-steroidal anti-inflammatories (NSAID): Secondary | ICD-10-CM

## 2014-03-07 DIAGNOSIS — T814XXA Infection following a procedure, initial encounter: Secondary | ICD-10-CM

## 2014-03-07 DIAGNOSIS — Z792 Long term (current) use of antibiotics: Secondary | ICD-10-CM

## 2014-03-07 DIAGNOSIS — W458XXA Other foreign body or object entering through skin, initial encounter: Secondary | ICD-10-CM

## 2014-03-07 DIAGNOSIS — S51812D Laceration without foreign body of left forearm, subsequent encounter: Secondary | ICD-10-CM

## 2014-03-07 DIAGNOSIS — T798XXA Other early complications of trauma, initial encounter: Secondary | ICD-10-CM

## 2014-03-07 DIAGNOSIS — Z87442 Personal history of urinary calculi: Secondary | ICD-10-CM | POA: Insufficient documentation

## 2014-03-07 MED ORDER — NAPROXEN 250 MG PO TABS
500.0000 mg | ORAL_TABLET | Freq: Once | ORAL | Status: AC
  Administered 2014-03-07: 500 mg via ORAL
  Filled 2014-03-07: qty 2

## 2014-03-07 MED ORDER — DOXYCYCLINE HYCLATE 100 MG PO CAPS: 100.0000 mg | ORAL_CAPSULE | Freq: Two times a day (BID) | ORAL | Status: DC

## 2014-03-07 MED ORDER — NAPROXEN 500 MG PO TABS: 500.0000 mg | ORAL_TABLET | Freq: Two times a day (BID) | ORAL | Status: DC

## 2014-03-07 NOTE — ED Notes (Signed)
Wound cleaned with sterile NS and bacitracin applied with nonadherant drsg and kerlex wrap applied. Tolerated well.
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
## 2014-03-07 NOTE — Discharge Instructions (Signed)
Keep wound clean and dry. Keep arm elevated. Return for any spreading of the redness. Take the antibiotic as directed. Take the Naprosyn as needed for pain. We are now well while the wound to heal on its own. The dressing that was placed today can be removed in 24 hours, wash with the soap and water and put antibiotic ointment on there and then a Band-Aid.

## 2014-03-07 NOTE — Discharge Instructions (Signed)
Keep wound dry and do not remove dressing for 24 hours if possible. After that, wash gently morning and night (every 12 hours) with soap and water. Use a topical antibiotic ointment and cover with a bandaid or gauze.  °  °Do NOT use rubbing alcohol or hydrogen peroxide, do not soak the area °  °Present to your primary care doctor or the urgent care of your choice, or the ED for suture removal in 7-10 days. °  °Every attempt was made to remove foreign body (contaminants) from the wound.  However, there is always a chance that some may remain in the wound. This can  increase your risk of infection. °  °If you see signs of infection (warmth, redness, tenderness, pus, sharp increase in pain, fever, red streaking in the skin) immediately return to the emergency department. °  °After the wound heals fully, apply sunscreen for 6-12 months to minimize scarring.  ° °

## 2014-03-07 NOTE — ED Provider Notes (Signed)
Medical screening examination/treatment/procedure(s) were performed by non-physician practitioner and as supervising physician I was immediately available for consultation/collaboration.   EKG Interpretation None       Shaden Higley, MD 03/07/14 2349 

## 2014-03-07 NOTE — ED Notes (Signed)
Declined W/C at D/C and was escorted to lobby by RN. 

## 2014-03-13 ENCOUNTER — Encounter (HOSPITAL_BASED_OUTPATIENT_CLINIC_OR_DEPARTMENT_OTHER): Payer: Self-pay | Admitting: Emergency Medicine

## 2014-04-08 ENCOUNTER — Emergency Department (HOSPITAL_BASED_OUTPATIENT_CLINIC_OR_DEPARTMENT_OTHER)
Admission: EM | Admit: 2014-04-08 | Discharge: 2014-04-08 | Disposition: A | Discharge status: Home / Self Care | Payer: Medicaid Other | Attending: Emergency Medicine | Admitting: Emergency Medicine

## 2014-04-08 ENCOUNTER — Emergency Department (HOSPITAL_BASED_OUTPATIENT_CLINIC_OR_DEPARTMENT_OTHER): Payer: Medicaid Other

## 2014-04-08 ENCOUNTER — Encounter (HOSPITAL_BASED_OUTPATIENT_CLINIC_OR_DEPARTMENT_OTHER): Payer: Self-pay | Admitting: Emergency Medicine

## 2014-04-08 DIAGNOSIS — Z8709 Personal history of other diseases of the respiratory system: Secondary | ICD-10-CM | POA: Diagnosis not present

## 2014-04-08 DIAGNOSIS — Z87442 Personal history of urinary calculi: Secondary | ICD-10-CM | POA: Diagnosis not present

## 2014-04-08 DIAGNOSIS — Z8701 Personal history of pneumonia (recurrent): Secondary | ICD-10-CM | POA: Insufficient documentation

## 2014-04-08 DIAGNOSIS — Y9289 Other specified places as the place of occurrence of the external cause: Secondary | ICD-10-CM | POA: Diagnosis not present

## 2014-04-08 DIAGNOSIS — Z792 Long term (current) use of antibiotics: Secondary | ICD-10-CM | POA: Insufficient documentation

## 2014-04-08 DIAGNOSIS — W01198A Fall on same level from slipping, tripping and stumbling with subsequent striking against other object, initial encounter: Secondary | ICD-10-CM | POA: Diagnosis not present

## 2014-04-08 DIAGNOSIS — Y92091 Bathroom in other non-institutional residence as the place of occurrence of the external cause: Secondary | ICD-10-CM | POA: Insufficient documentation

## 2014-04-08 DIAGNOSIS — N189 Chronic kidney disease, unspecified: Secondary | ICD-10-CM | POA: Insufficient documentation

## 2014-04-08 DIAGNOSIS — Y998 Other external cause status: Secondary | ICD-10-CM | POA: Insufficient documentation

## 2014-04-08 DIAGNOSIS — Y9389 Activity, other specified: Secondary | ICD-10-CM | POA: Insufficient documentation

## 2014-04-08 DIAGNOSIS — W19XXXA Unspecified fall, initial encounter: Secondary | ICD-10-CM

## 2014-04-08 DIAGNOSIS — R4182 Altered mental status, unspecified: Secondary | ICD-10-CM | POA: Diagnosis present

## 2014-04-08 DIAGNOSIS — S0990XA Unspecified injury of head, initial encounter: Secondary | ICD-10-CM

## 2014-04-08 DIAGNOSIS — Z79899 Other long term (current) drug therapy: Secondary | ICD-10-CM | POA: Insufficient documentation

## 2014-04-08 DIAGNOSIS — S161XXA Strain of muscle, fascia and tendon at neck level, initial encounter: Secondary | ICD-10-CM

## 2014-04-08 DIAGNOSIS — S0083XA Contusion of other part of head, initial encounter: Secondary | ICD-10-CM | POA: Diagnosis not present

## 2014-04-08 DIAGNOSIS — S0093XA Contusion of unspecified part of head, initial encounter: Secondary | ICD-10-CM

## 2014-04-08 MED ORDER — HYDROCODONE-ACETAMINOPHEN 5-325 MG PO TABS: 1.0000 | ORAL_TABLET | Freq: Four times a day (QID) | ORAL | Status: DC | PRN

## 2014-04-08 NOTE — ED Provider Notes (Signed)
CSN: [id number]     Arrival date & time 04/08/14  2111 History  This chart was scribed for Tilden FossaElizabeth Ashawn Rinehart, MD by Gwenyth Oberatherine Macek, ED Scribe. This patient was seen in room MH06/MH06 and the patient's care was started at 9:37 PM.    Chief Complaint  Patient presents with  . Fall  . Altered Mental Status   The history is provided by the patient. No language interpreter was used.    HPI Comments: Katherine Conrad is a 23 y.o. female who presents to the Emergency Department complaining of constant head pain after falling backwards in the shower while cleaning a few hours ago. She states mild neck pain as an associated symptom. Pt notes she was cleaning her shower and mis-stepped, causing her to fall backwards and hit the bottom, back of her head. Her husband states that he heard a loud bang and found her in the shower, struggling to get up. She denies LOC as a result of the fall. Pt states she is currently tired because she just worked a 12 hour shift. Her husband states that her slow speech is normal for her when she is tired. Pt had 1 beer at lunch, 10 hours ago. She denies smoking. Pt denies history of medical problems, concussions, head injuries and allergies. She does not take medication regularly. Pt had a seizure 2 years ago, but does not take medication for symptoms. She denies possibility of pregnancy. Pt also denies nausea, vomiting and difficulty walking as associated symptoms.  Past Medical History  Diagnosis Date  . Pneumonia   . Chronic kidney disease     Kidney stones  . Flu   . Renal calculi    Past Surgical History  Procedure Laterality Date  . Tonsillectomy    . Laparoscopic abdominal exploration     Family History  Problem Relation Age of Onset  . Alcohol abuse Neg Hx   . Arthritis Neg Hx   . Asthma Neg Hx   . Birth defects Neg Hx   . Cancer Neg Hx   . COPD Neg Hx   . Depression Neg Hx   . Diabetes Neg Hx   . Drug abuse Neg Hx   . Early death Neg Hx   . Hearing loss  Neg Hx   . Heart disease Neg Hx   . Hyperlipidemia Neg Hx   . Hypertension Neg Hx   . Kidney disease Neg Hx   . Learning disabilities Neg Hx   . Mental illness Neg Hx   . Mental retardation Neg Hx   . Miscarriages / Stillbirths Neg Hx   . Stroke Neg Hx   . Vision loss Neg Hx   . Varicose Veins Neg Hx    History  Substance Use Topics  . Smoking status: Never Smoker   . Smokeless tobacco: Never Used  . Alcohol Use: No   OB History    Gravida Para Term Preterm AB TAB SAB Ectopic Multiple Living   2 1 1       1      Review of Systems  Gastrointestinal: Negative for nausea and vomiting.  Musculoskeletal: Positive for neck pain. Negative for gait problem.  Neurological: Positive for headaches. Negative for weakness and numbness.  All other systems reviewed and are negative.     Allergies  Review of patient's allergies indicates no known allergies.  Home Medications   Prior to Admission medications   Medication Sig Start Date End Date Taking? Authorizing Provider  acetaminophen (TYLENOL) 325  MG tablet Take 650 mg by mouth daily as needed.    Historical Provider, MD  calcium carbonate (TUMS - DOSED IN MG ELEMENTAL CALCIUM) 500 MG chewable tablet Chew 2 tablets by mouth 3 (three) times daily as needed for indigestion or heartburn.    Historical Provider, MD  doxycycline (VIBRAMYCIN) 100 MG capsule Take 1 capsule (100 mg total) by mouth 2 (two) times daily. 03/07/14   Vanetta Mulders, MD  HYDROcodone-acetaminophen (NORCO) 5-325 MG per tablet Take 1-2 tablets by mouth every 6 (six) hours as needed. 02/14/14   Geoffery Lyons, MD  HYDROcodone-acetaminophen (NORCO/VICODIN) 5-325 MG per tablet Take 1 tablet by mouth every 6 (six) hours as needed for moderate pain or severe pain. 03/06/14   Gwyneth Sprout, MD  Multiple Vitamin (MULTIVITAMIN) capsule Take 1 capsule by mouth daily.    Historical Provider, MD  naproxen (NAPROSYN) 500 MG tablet Take 1 tablet (500 mg total) by mouth 2 (two)  times daily. 03/07/14   Vanetta Mulders, MD  oxyCODONE-acetaminophen (PERCOCET) 5-325 MG per tablet Take 2 tablets by mouth every 8 (eight) hours as needed for severe pain. 12/10/13   Hurman Horn, MD  Prenatal Vit-Fe Fumarate-FA (PRENATAL MULTIVITAMIN) TABS tablet Take 1 tablet by mouth daily at 12 noon.    Historical Provider, MD   BP 101/62 mmHg  Pulse 87  Temp(Src) 97.8 F (36.6 C)  Resp 18  Ht 5\' 7"  (1.702 m)  Wt 125 lb (56.7 kg)  BMI 19.57 kg/m2  SpO2 100%  LMP 04/01/2014 Physical Exam  Constitutional: She is oriented to person, place, and time. She appears well-developed and well-nourished.  Mild distress.   HENT:  Head: Normocephalic and atraumatic.  Eyes: Pupils are equal, round, and reactive to light.  Neck:  Lateral cervical tenderness, no midline tenderness.  Cardiovascular: Normal rate and regular rhythm.   No murmur heard. Pulmonary/Chest: Effort normal and breath sounds normal. No respiratory distress.  Abdominal: Soft. There is no tenderness. There is no rebound and no guarding.  Musculoskeletal: She exhibits no edema or tenderness.  Neurological: She is alert and oriented to person, place, and time.  Slow speech. PERRL. 5/5 strength in all 4 extremities.   Skin: Skin is warm and dry.  Psychiatric: She has a normal mood and affect. Her behavior is normal.  Nursing note and vitals reviewed.   ED Course  Procedures (including critical care time) DIAGNOSTIC STUDIES: Oxygen Saturation is 100% on RA, normal by my interpretation.    COORDINATION OF CARE: 9:50 PM Discussed treatment plan with pt which includes CT head and pt agreed to plan.  Labs Review Labs Reviewed - No data to display  Imaging Review Ct Head Wo Contrast  04/08/2014   CLINICAL DATA:  Status post fall in bathroom. Hit back of head on bathtub. No loss of consciousness. Slow and slurred speech. Concern for cervical spine injury. Initial encounter.  EXAM: CT HEAD WITHOUT CONTRAST  CT CERVICAL  SPINE WITHOUT CONTRAST  TECHNIQUE: Multidetector CT imaging of the head and cervical spine was performed following the standard protocol without intravenous contrast. Multiplanar CT image reconstructions of the cervical spine were also generated.  COMPARISON:  CT of the head performed 06/28/2013, and CT of the cervical spine performed 01/06/2013  FINDINGS: CT HEAD FINDINGS  There is no evidence of acute infarction, mass lesion, or intra- or extra-axial hemorrhage on CT.  The posterior fossa, including the cerebellum, brainstem and fourth ventricle, is within normal limits. The third and lateral ventricles, and basal  ganglia are unremarkable in appearance. The cerebral hemispheres are symmetric in appearance, with normal gray-white differentiation. No mass effect or midline shift is seen.  There is no evidence of fracture; visualized osseous structures are unremarkable in appearance. The visualized portions of the orbits are within normal limits. The paranasal sinuses and mastoid air cells are well-aerated. No significant soft tissue abnormalities are seen.  CT CERVICAL SPINE FINDINGS  There is no evidence of fracture or subluxation. Vertebral bodies demonstrate normal height and alignment. Intervertebral disc spaces are preserved. Prevertebral soft tissues are within normal limits. The visualized neural foramina are grossly unremarkable.  The thyroid gland is unremarkable in appearance. The visualized lung apices are clear. No significant soft tissue abnormalities are seen.  IMPRESSION: 1. No evidence of traumatic intracranial injury or fracture. 2. No evidence of fracture or subluxation along the cervical spine.   Electronically Signed   By: Roanna RaiderJeffery  Chang M.D.   On: 04/08/2014 22:05   Ct Cervical Spine Wo Contrast  04/08/2014   CLINICAL DATA:  Status post fall in bathroom. Hit back of head on bathtub. No loss of consciousness. Slow and slurred speech. Concern for cervical spine injury. Initial encounter.   EXAM: CT HEAD WITHOUT CONTRAST  CT CERVICAL SPINE WITHOUT CONTRAST  TECHNIQUE: Multidetector CT imaging of the head and cervical spine was performed following the standard protocol without intravenous contrast. Multiplanar CT image reconstructions of the cervical spine were also generated.  COMPARISON:  CT of the head performed 06/28/2013, and CT of the cervical spine performed 01/06/2013  FINDINGS: CT HEAD FINDINGS  There is no evidence of acute infarction, mass lesion, or intra- or extra-axial hemorrhage on CT.  The posterior fossa, including the cerebellum, brainstem and fourth ventricle, is within normal limits. The third and lateral ventricles, and basal ganglia are unremarkable in appearance. The cerebral hemispheres are symmetric in appearance, with normal gray-white differentiation. No mass effect or midline shift is seen.  There is no evidence of fracture; visualized osseous structures are unremarkable in appearance. The visualized portions of the orbits are within normal limits. The paranasal sinuses and mastoid air cells are well-aerated. No significant soft tissue abnormalities are seen.  CT CERVICAL SPINE FINDINGS  There is no evidence of fracture or subluxation. Vertebral bodies demonstrate normal height and alignment. Intervertebral disc spaces are preserved. Prevertebral soft tissues are within normal limits. The visualized neural foramina are grossly unremarkable.  The thyroid gland is unremarkable in appearance. The visualized lung apices are clear. No significant soft tissue abnormalities are seen.  IMPRESSION: 1. No evidence of traumatic intracranial injury or fracture. 2. No evidence of fracture or subluxation along the cervical spine.   Electronically Signed   By: Roanna RaiderJeffery  Chang M.D.   On: 04/08/2014 22:05     EKG Interpretation None      MDM   Final diagnoses:  Fall, initial encounter  Head contusion, initial encounter  Cervical strain, acute, initial encounter    Patient here  for evaluation of injuries after a fall. There is no evidence of trauma on exam. Clinical picture are not consistent with serious closed head injury, cspine cleared in department after CT. Discussed with patient care for cervical strain as well as return precautions.  I personally performed the services described in this documentation, which was scribed in my presence. The recorded information has been reviewed and is accurate.    Tilden FossaElizabeth Camrie Stock, MD 04/09/14 0030

## 2014-04-08 NOTE — Discharge Instructions (Signed)

## 2014-04-08 NOTE — ED Notes (Signed)
Pt presents to ED with complaints of fall in bathroom hitting the back of her head on bath tub. Pt speech slow and slurred .

## 2014-04-13 ENCOUNTER — Encounter: Payer: Self-pay | Admitting: Obstetrics & Gynecology

## 2014-09-11 IMAGING — CR DG WRIST COMPLETE 3+V*L*
4 series · 4 of 4 positions shown · non-contrast
Comparison: None.

CLINICAL DATA: Left wrist pain, swelling and abrasions following an
injury today.

EXAM:
LEFT WRIST - COMPLETE 3+ VIEW

[x wrist pa left]
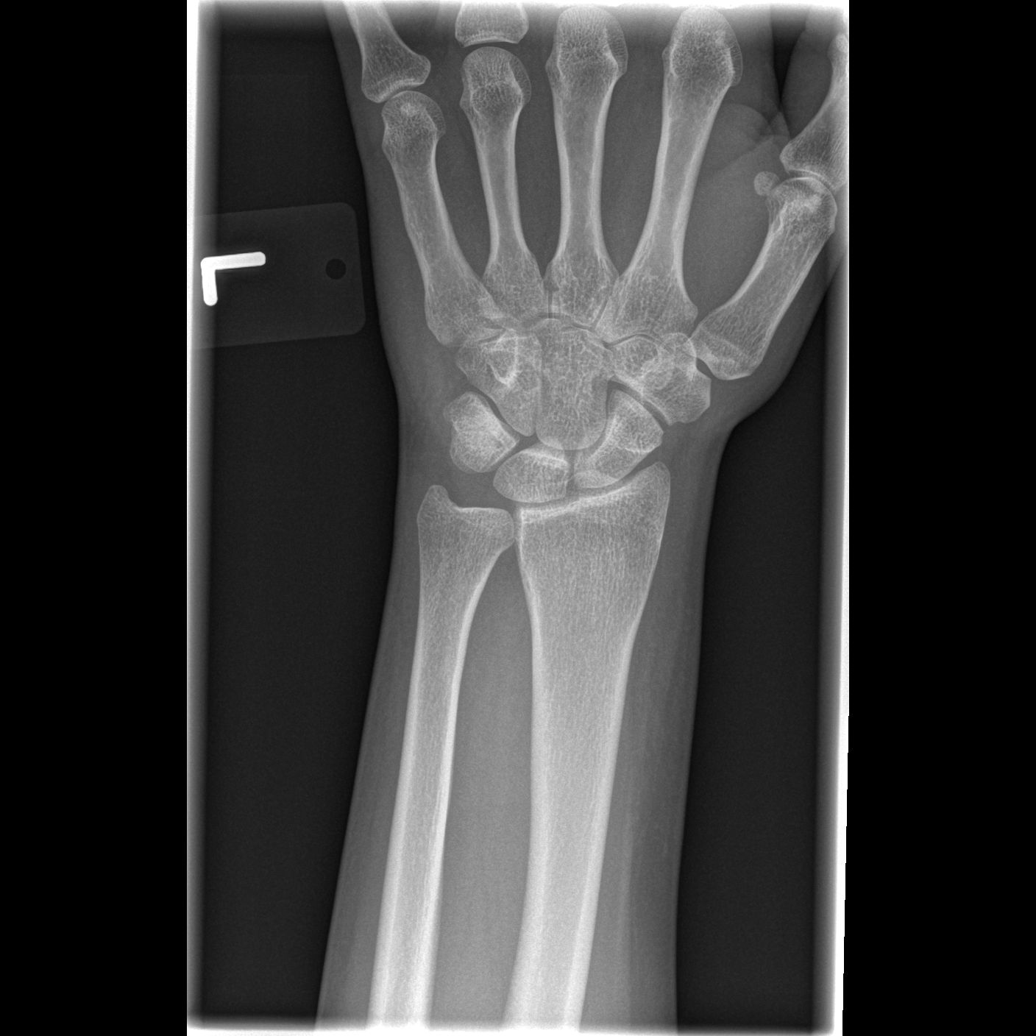

[x wrist obl left]
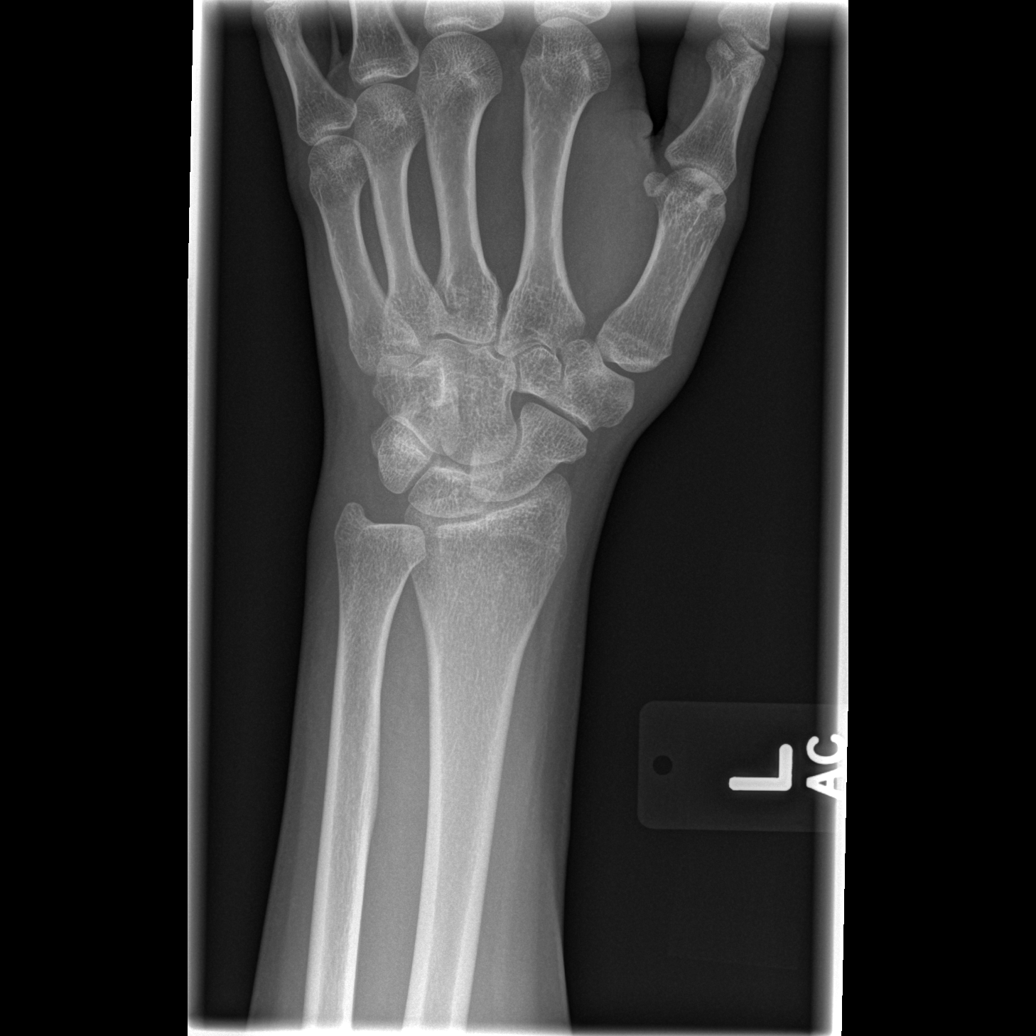

[x wrist lat left]
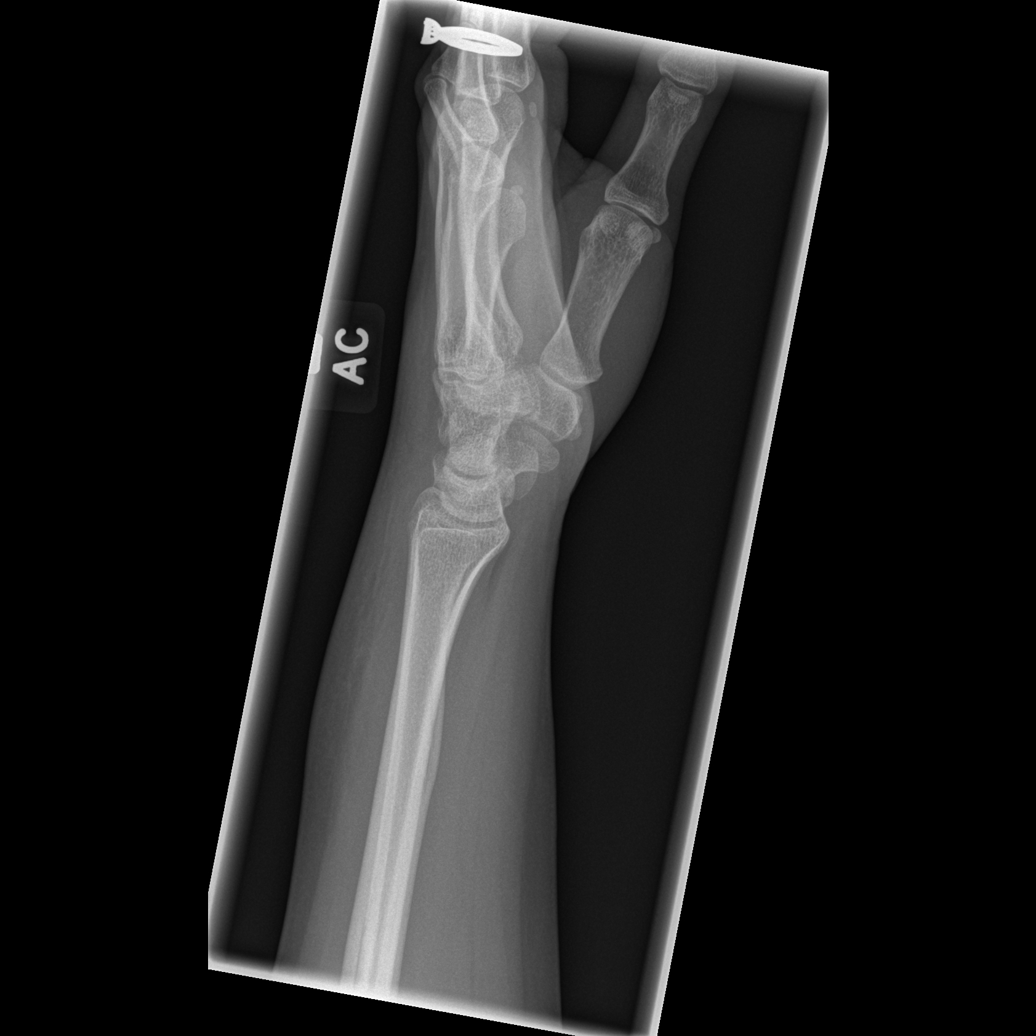

[x navicular]
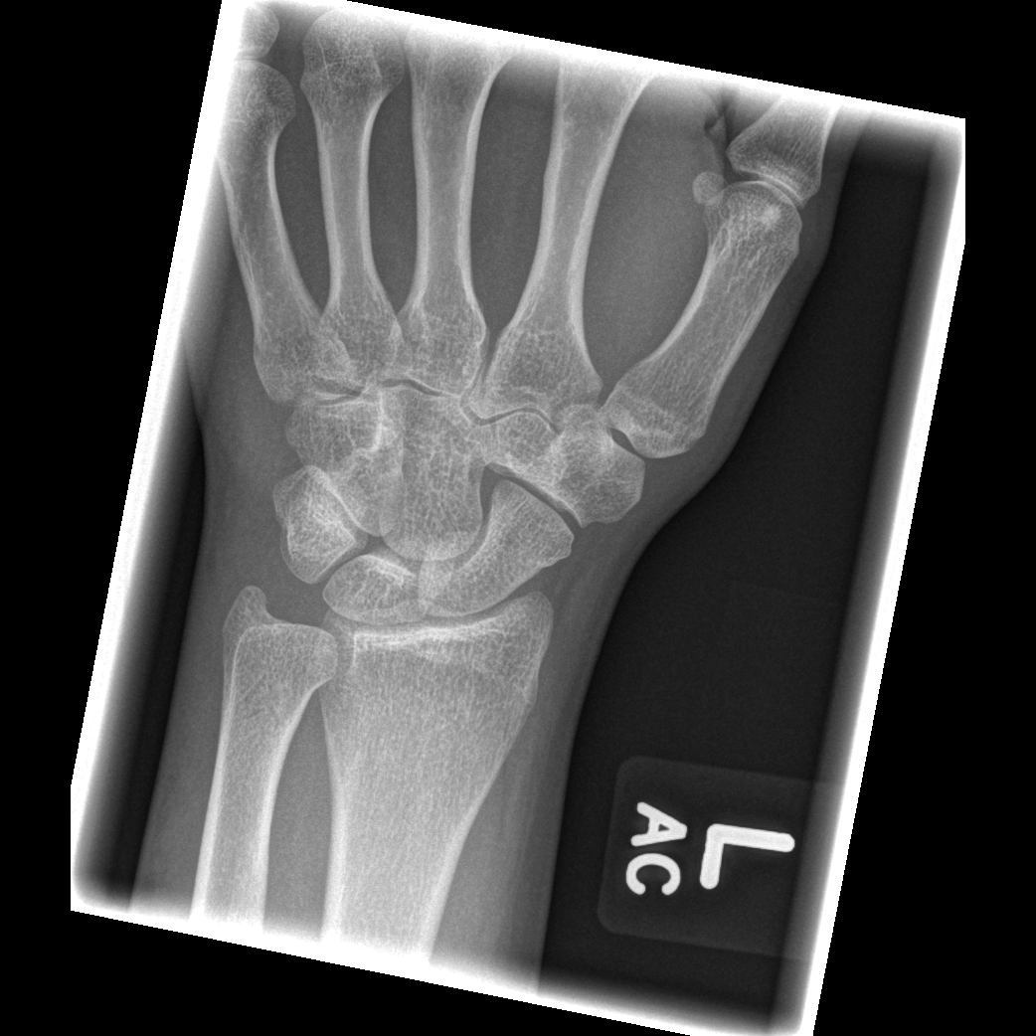

[4 of 4 positions shown; findings below may reference images not displayed]

FINDINGS: There is no evidence of fracture or dislocation. There is no
evidence of arthropathy or other focal bone abnormality. Soft
tissues are unremarkable.
IMPRESSION: Normal examination.

## 2014-09-17 ENCOUNTER — Emergency Department (HOSPITAL_BASED_OUTPATIENT_CLINIC_OR_DEPARTMENT_OTHER)
Admission: EM | Admit: 2014-09-17 | Discharge: 2014-09-17 | Disposition: A | Discharge status: Home / Self Care | Payer: Medicaid Other | Attending: Emergency Medicine | Admitting: Emergency Medicine

## 2014-09-17 ENCOUNTER — Encounter (HOSPITAL_BASED_OUTPATIENT_CLINIC_OR_DEPARTMENT_OTHER): Payer: Self-pay | Admitting: *Deleted

## 2014-09-17 ENCOUNTER — Emergency Department (HOSPITAL_BASED_OUTPATIENT_CLINIC_OR_DEPARTMENT_OTHER): Payer: Medicaid Other

## 2014-09-17 DIAGNOSIS — Z79899 Other long term (current) drug therapy: Secondary | ICD-10-CM | POA: Insufficient documentation

## 2014-09-17 DIAGNOSIS — Z87442 Personal history of urinary calculi: Secondary | ICD-10-CM | POA: Insufficient documentation

## 2014-09-17 DIAGNOSIS — J189 Pneumonia, unspecified organism: Secondary | ICD-10-CM

## 2014-09-17 DIAGNOSIS — N189 Chronic kidney disease, unspecified: Secondary | ICD-10-CM | POA: Insufficient documentation

## 2014-09-17 DIAGNOSIS — R05 Cough: Secondary | ICD-10-CM | POA: Diagnosis present

## 2014-09-17 DIAGNOSIS — J159 Unspecified bacterial pneumonia: Secondary | ICD-10-CM | POA: Insufficient documentation

## 2014-09-17 DIAGNOSIS — Z791 Long term (current) use of non-steroidal anti-inflammatories (NSAID): Secondary | ICD-10-CM | POA: Insufficient documentation

## 2014-09-17 MED ORDER — AZITHROMYCIN 250 MG PO TABS: 250.0000 mg | ORAL_TABLET | Freq: Every day | ORAL | Status: DC

## 2014-09-17 MED ORDER — HYDROCOD POLST-CPM POLST ER 10-8 MG/5ML PO SUER: 5.0000 mL | Freq: Two times a day (BID) | ORAL | Status: DC | PRN

## 2014-09-17 NOTE — ED Notes (Signed)
Pt seen by EDP prior to  RN assessment, see MD notes, orders received to d/c, care assumed at time of d/c, pt alert, NAD, calm, interactive, c/o congested cough and DOE. Also reports fever. Verbalizes understanding, denies needs or questions, given Rx x2, steady gait, "ready to go".

## 2014-09-17 NOTE — Discharge Instructions (Signed)

## 2014-09-17 NOTE — ED Notes (Signed)
Pt reports cough and fever since Wednesday

## 2014-09-17 NOTE — ED Provider Notes (Signed)
CSN: [id number]     Arrival date & time 09/17/14  [id number] History  This chart was scribed for Tilden FossaElizabeth Dewaine Morocho, MD by Roxy Cedarhandni Bhalodia, ED Scribe. This patient was seen in room MH01/MH01 and the patient's care was started at 8:55 PM.   Chief Complaint  Patient presents with  . Cough   Patient is a 24 y.o. female presenting with cough. The history is provided by the patient. No language interpreter was used.  Cough Associated symptoms: diaphoresis, fever and sore throat    HPI Comments: Katherine SecondKayla Conrad is a 24 y.o. female with a PMHx of pneumonia who presents to the Emergency Department complaining of moderate, gradually worsening cough and sore throat that began 1 week ago. She reports associated chest pain and soreness of ribs with cough. Patient also has associated fever with maximum fever of 102.5 degrees F and diaphoresis onset 1 week ago. Patient reports prior hx of pneumonia. She denies associated vomiting, or diarrhea. She denies chance of pregnancy. Patient does not smoke or drink alcohol. Patient states she took Robitussin, Dayquil and Muccinex with no relief. Sxs are moderate, constant, worsening.   Past Medical History  Diagnosis Date  . Pneumonia   . Chronic kidney disease     Kidney stones  . Flu   . Renal calculi    Past Surgical History  Procedure Laterality Date  . Tonsillectomy    . Laparoscopic abdominal exploration     Family History  Problem Relation Age of Onset  . Alcohol abuse Neg Hx   . Arthritis Neg Hx   . Asthma Neg Hx   . Birth defects Neg Hx   . Cancer Neg Hx   . COPD Neg Hx   . Depression Neg Hx   . Diabetes Neg Hx   . Drug abuse Neg Hx   . Early death Neg Hx   . Hearing loss Neg Hx   . Heart disease Neg Hx   . Hyperlipidemia Neg Hx   . Hypertension Neg Hx   . Kidney disease Neg Hx   . Learning disabilities Neg Hx   . Mental illness Neg Hx   . Mental retardation Neg Hx   . Miscarriages / Stillbirths Neg Hx   . Stroke Neg Hx   . Vision loss Neg Hx    . Varicose Veins Neg Hx    History  Substance Use Topics  . Smoking status: Never Smoker   . Smokeless tobacco: Never Used  . Alcohol Use: No   OB History    Gravida Para Term Preterm AB TAB SAB Ectopic Multiple Living   2 1 1       1      Review of Systems  Constitutional: Positive for fever and diaphoresis.  HENT: Positive for sore throat.   Respiratory: Positive for cough and chest tightness.   Gastrointestinal: Negative for vomiting and diarrhea.   Allergies  Review of patient's allergies indicates no known allergies.  Home Medications   Prior to Admission medications   Medication Sig Start Date End Date Taking? Authorizing Provider  acetaminophen (TYLENOL) 325 MG tablet Take 650 mg by mouth daily as needed.    Historical Provider, MD  calcium carbonate (TUMS - DOSED IN MG ELEMENTAL CALCIUM) 500 MG chewable tablet Chew 2 tablets by mouth 3 (three) times daily as needed for indigestion or heartburn.    Historical Provider, MD  doxycycline (VIBRAMYCIN) 100 MG capsule Take 1 capsule (100 mg total) by mouth 2 (two) times daily. 03/07/14  Vanetta MuldersScott Zackowski, MD  HYDROcodone-acetaminophen (NORCO/VICODIN) 5-325 MG per tablet Take 1 tablet by mouth every 6 (six) hours as needed for moderate pain or severe pain. 04/08/14   Tilden FossaElizabeth Venesa Semidey, MD  Multiple Vitamin (MULTIVITAMIN) capsule Take 1 capsule by mouth daily.    Historical Provider, MD  naproxen (NAPROSYN) 500 MG tablet Take 1 tablet (500 mg total) by mouth 2 (two) times daily. 03/07/14   Vanetta MuldersScott Zackowski, MD  oxyCODONE-acetaminophen (PERCOCET) 5-325 MG per tablet Take 2 tablets by mouth every 8 (eight) hours as needed for severe pain. 12/10/13   Wayland SalinasJohn Bednar, MD  Prenatal Vit-Fe Fumarate-FA (PRENATAL MULTIVITAMIN) TABS tablet Take 1 tablet by mouth daily at 12 noon.    Historical Provider, MD   Triage Vitals: BP 138/74 mmHg  Pulse 105  Temp(Src) 98.6 F (37 C) (Oral)  Resp 20  Ht 5\' 7"  (1.702 m)  Wt 133 lb (60.328 kg)  BMI 20.83  kg/m2  SpO2 100%  LMP 08/18/2014  Physical Exam  Constitutional: She is oriented to person, place, and time. She appears well-developed and well-nourished.  HENT:  Head: Normocephalic and atraumatic.  Mouth/Throat: Oropharynx is clear and moist.  Neck: Neck supple.  Cardiovascular: Normal rate and regular rhythm.   No murmur heard. Pulmonary/Chest: Effort normal and breath sounds normal. No respiratory distress.  Abdominal: Soft. There is no tenderness. There is no rebound and no guarding.  Musculoskeletal: She exhibits no edema or tenderness.  Neurological: She is alert and oriented to person, place, and time.  Skin: Skin is warm and dry.  Psychiatric: She has a normal mood and affect. Her behavior is normal.  Nursing note and vitals reviewed.  ED Course  Procedures (including critical care time)  DIAGNOSTIC STUDIES: Oxygen Saturation is 100% on RA, normal by my interpretation.    COORDINATION OF CARE: 8:58 PM- Discussed plans to order diagnostic CXR. Pt advised of plan for treatment and pt agrees.  Labs Review Labs Reviewed - No data to display  Imaging Review No results found.   EKG Interpretation None     MDM   Final diagnoses:  CAP (community acquired pneumonia)    Pt here for evaluation of fever and cough.  Pt is nontoxic on exam with no respiratory distress.  CXR with possible pna - treating for CAP given sxs and xray.  Discussed PCP follow up as well as return precautions.  Presentation not c/w PE, sepsis.    I personally performed the services described in this documentation, which was scribed in my presence. The recorded information has been reviewed and is accurate.   Tilden FossaElizabeth Alania Overholt, MD 09/17/14 2133
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
## 2014-09-28 ENCOUNTER — Emergency Department (HOSPITAL_COMMUNITY): Payer: Medicaid Other

## 2014-09-28 ENCOUNTER — Emergency Department (HOSPITAL_COMMUNITY)
Admission: EM | Admit: 2014-09-28 | Discharge: 2014-09-28 | Disposition: A | Discharge status: Home / Self Care | Payer: Medicaid Other | Attending: Emergency Medicine | Admitting: Emergency Medicine

## 2014-09-28 ENCOUNTER — Encounter (HOSPITAL_COMMUNITY): Payer: Self-pay | Admitting: Emergency Medicine

## 2014-09-28 DIAGNOSIS — Z79899 Other long term (current) drug therapy: Secondary | ICD-10-CM | POA: Diagnosis not present

## 2014-09-28 DIAGNOSIS — N189 Chronic kidney disease, unspecified: Secondary | ICD-10-CM | POA: Diagnosis not present

## 2014-09-28 DIAGNOSIS — J159 Unspecified bacterial pneumonia: Secondary | ICD-10-CM | POA: Diagnosis not present

## 2014-09-28 DIAGNOSIS — Z87442 Personal history of urinary calculi: Secondary | ICD-10-CM | POA: Diagnosis not present

## 2014-09-28 DIAGNOSIS — Z792 Long term (current) use of antibiotics: Secondary | ICD-10-CM | POA: Diagnosis not present

## 2014-09-28 DIAGNOSIS — Z3202 Encounter for pregnancy test, result negative: Secondary | ICD-10-CM | POA: Diagnosis not present

## 2014-09-28 DIAGNOSIS — R079 Chest pain, unspecified: Secondary | ICD-10-CM | POA: Diagnosis not present

## 2014-09-28 DIAGNOSIS — R1110 Vomiting, unspecified: Secondary | ICD-10-CM | POA: Diagnosis present

## 2014-09-28 DIAGNOSIS — J189 Pneumonia, unspecified organism: Secondary | ICD-10-CM

## 2014-09-28 LAB — I-STAT CHEM 8, ED
BUN: 14 mg/dL (ref 6–20)
CREATININE: 0.8 mg/dL (ref 0.44–1.00)
Calcium, Ion: 1.19 mmol/L (ref 1.12–1.23)
Chloride: 100 mmol/L — ABNORMAL LOW (ref 101–111)
Glucose, Bld: 104 mg/dL — ABNORMAL HIGH (ref 65–99)
HCT: 46 % (ref 36.0–46.0)
HEMOGLOBIN: 15.6 g/dL — AB (ref 12.0–15.0)
Potassium: 3.9 mmol/L (ref 3.5–5.1)
Sodium: 139 mmol/L (ref 135–145)
TCO2: 23 mmol/L (ref 0–100)

## 2014-09-28 LAB — URINE MICROSCOPIC-ADD ON

## 2014-09-28 LAB — CBC WITH DIFFERENTIAL/PLATELET
BASOS ABS: 0 10*3/uL (ref 0.0–0.1)
BASOS PCT: 0 % (ref 0–1)
Eosinophils Absolute: 0 10*3/uL (ref 0.0–0.7)
Eosinophils Relative: 0 % (ref 0–5)
HCT: 41.5 % (ref 36.0–46.0)
HEMOGLOBIN: 14.1 g/dL (ref 12.0–15.0)
LYMPHS ABS: 1.1 10*3/uL (ref 0.7–4.0)
Lymphocytes Relative: 7 % — ABNORMAL LOW (ref 12–46)
MCH: 29.7 pg (ref 26.0–34.0)
MCHC: 34 g/dL (ref 30.0–36.0)
MCV: 87.6 fL (ref 78.0–100.0)
Monocytes Absolute: 0.5 10*3/uL (ref 0.1–1.0)
Monocytes Relative: 3 % (ref 3–12)
NEUTROS ABS: 14.6 10*3/uL — AB (ref 1.7–7.7)
Neutrophils Relative %: 90 % — ABNORMAL HIGH (ref 43–77)
Platelets: 210 10*3/uL (ref 150–400)
RBC: 4.74 MIL/uL (ref 3.87–5.11)
RDW: 12.3 % (ref 11.5–15.5)
WBC: 16.2 10*3/uL — ABNORMAL HIGH (ref 4.0–10.5)

## 2014-09-28 LAB — URINALYSIS, ROUTINE W REFLEX MICROSCOPIC
Bilirubin Urine: NEGATIVE
Glucose, UA: NEGATIVE mg/dL
Hgb urine dipstick: NEGATIVE
Ketones, ur: NEGATIVE mg/dL
LEUKOCYTES UA: NEGATIVE
Nitrite: NEGATIVE
Protein, ur: 30 mg/dL — AB
SPECIFIC GRAVITY, URINE: 1.031 — AB (ref 1.005–1.030)
UROBILINOGEN UA: 0.2 mg/dL (ref 0.0–1.0)
pH: 8.5 — ABNORMAL HIGH (ref 5.0–8.0)

## 2014-09-28 LAB — POC URINE PREG, ED: Preg Test, Ur: NEGATIVE

## 2014-09-28 LAB — D-DIMER, QUANTITATIVE (NOT AT ARMC): D-Dimer, Quant: 0.42 ug/mL-FEU (ref 0.00–0.48)

## 2014-09-28 LAB — PREGNANCY, URINE: Preg Test, Ur: NEGATIVE

## 2014-09-28 MED ORDER — LEVOFLOXACIN 250 MG PO TABS: 750.0000 mg | ORAL_TABLET | Freq: Every day | ORAL | Status: DC

## 2014-09-28 MED ORDER — LEVOFLOXACIN 750 MG PO TABS
750.0000 mg | ORAL_TABLET | Freq: Once | ORAL | Status: AC
  Administered 2014-09-28: 750 mg via ORAL
  Filled 2014-09-28: qty 1

## 2014-09-28 MED ORDER — HYDROMORPHONE HCL 1 MG/ML IJ SOLN
1.0000 mg | Freq: Once | INTRAMUSCULAR | Status: AC
  Administered 2014-09-28: 1 mg via INTRAVENOUS
  Filled 2014-09-28: qty 1

## 2014-09-28 MED ORDER — PROCHLORPERAZINE MALEATE 10 MG PO TABS
10.0000 mg | ORAL_TABLET | Freq: Once | ORAL | Status: AC
  Administered 2014-09-28: 10 mg via ORAL
  Filled 2014-09-28: qty 1

## 2014-09-28 NOTE — ED Notes (Signed)
PA at bedside.

## 2014-09-28 NOTE — ED Notes (Signed)
Pt leaving for XRay. 

## 2014-09-28 NOTE — ED Provider Notes (Signed)
CSN: [id number]     Arrival date & time 09/28/14  1553 History   First MD Initiated Contact with Patient 09/28/14 1742     Chief Complaint  Patient presents with  . Emesis    HPI   24 year old female presents today with cough, posterior chest pain, weakness and fatigue. Patient reports that 2 weeks ago she was diagnosed with pneumonia, prescribed azithromycin, and had improvement of symptoms. She reports that 2 days ago she had a return of symptoms including increased cough, productive, subjective fevers, body aches, back pain, headache. Patient denies chest pain, shortness of breath, abdominal pain, changes in her urine color clarity or characteristics, diarrhea, lower extremity swelling or edema. Patient reports the headache as frontal, with no changes in vision, focal neurological deficits, photophobia, phonophobia. Patient reports she used ibuprofen before coming to the ED along with Benadryl.    Past Medical History  Diagnosis Date  . Pneumonia   . Chronic kidney disease     Kidney stones  . Flu   . Renal calculi    Past Surgical History  Procedure Laterality Date  . Tonsillectomy    . Laparoscopic abdominal exploration     Family History  Problem Relation Age of Onset  . Alcohol abuse Neg Hx   . Arthritis Neg Hx   . Asthma Neg Hx   . Birth defects Neg Hx   . Cancer Neg Hx   . COPD Neg Hx   . Depression Neg Hx   . Diabetes Neg Hx   . Drug abuse Neg Hx   . Early death Neg Hx   . Hearing loss Neg Hx   . Heart disease Neg Hx   . Hyperlipidemia Neg Hx   . Hypertension Neg Hx   . Kidney disease Neg Hx   . Learning disabilities Neg Hx   . Mental illness Neg Hx   . Mental retardation Neg Hx   . Miscarriages / Stillbirths Neg Hx   . Stroke Neg Hx   . Vision loss Neg Hx   . Varicose Veins Neg Hx    History  Substance Use Topics  . Smoking status: Never Smoker   . Smokeless tobacco: Never Used  . Alcohol Use: No   OB History    Gravida Para Term Preterm AB TAB SAB  Ectopic Multiple Living   2 1 1       1      Review of Systems  All other systems reviewed and are negative.     Allergies  Review of patient's allergies indicates no known allergies.  Home Medications   Prior to Admission medications   Medication Sig Start Date End Date Taking? Authorizing Provider  acetaminophen (TYLENOL) 325 MG tablet Take 650 mg by mouth daily as needed.    Historical Provider, MD  azithromycin (ZITHROMAX) 250 MG tablet Take 1 tablet (250 mg total) by mouth daily. Take first 2 tablets together, then 1 every day until finished. 09/17/14   Tilden FossaElizabeth Rees, MD  calcium carbonate (TUMS - DOSED IN MG ELEMENTAL CALCIUM) 500 MG chewable tablet Chew 2 tablets by mouth 3 (three) times daily as needed for indigestion or heartburn.    Historical Provider, MD  chlorpheniramine-HYDROcodone (TUSSIONEX PENNKINETIC ER) 10-8 MG/5ML SUER Take 5 mLs by mouth every 12 (twelve) hours as needed for cough. 09/17/14   Tilden FossaElizabeth Rees, MD  doxycycline (VIBRAMYCIN) 100 MG capsule Take 1 capsule (100 mg total) by mouth 2 (two) times daily. 03/07/14   Vanetta MuldersScott Zackowski, MD  HYDROcodone-acetaminophen (NORCO/VICODIN) 5-325 MG per tablet Take 1 tablet by mouth every 6 (six) hours as needed for moderate pain or severe pain. 04/08/14   Tilden FossaElizabeth Rees, MD  Multiple Vitamin (MULTIVITAMIN) capsule Take 1 capsule by mouth daily.    Historical Provider, MD  naproxen (NAPROSYN) 500 MG tablet Take 1 tablet (500 mg total) by mouth 2 (two) times daily. 03/07/14   Vanetta MuldersScott Zackowski, MD  oxyCODONE-acetaminophen (PERCOCET) 5-325 MG per tablet Take 2 tablets by mouth every 8 (eight) hours as needed for severe pain. 12/10/13   Wayland SalinasJohn Bednar, MD  Prenatal Vit-Fe Fumarate-FA (PRENATAL MULTIVITAMIN) TABS tablet Take 1 tablet by mouth daily at 12 noon.    Historical Provider, MD   BP 113/66 mmHg  Pulse 90  Temp(Src) 98.9 F (37.2 C) (Oral)  Resp 16  Ht 5\' 6"  (1.676 m)  Wt 130 lb 2 oz (59.024 kg)  BMI 21.01 kg/m2  SpO2 100%   LMP 09/17/2014 Physical Exam  Constitutional: She is oriented to person, place, and time. She appears well-developed and well-nourished.  No acute distress, nontoxic appearing.  HENT:  Head: Normocephalic and atraumatic.  Eyes: Pupils are equal, round, and reactive to light.  Neck: Normal range of motion. Neck supple. No JVD present. No tracheal deviation present. No thyromegaly present.  Cardiovascular: Regular rhythm, normal heart sounds and intact distal pulses.  Exam reveals no gallop and no friction rub.   No murmur heard. Pulmonary/Chest: Effort normal. No stridor. No respiratory distress. She has no wheezes. She has rales. She exhibits no tenderness.  Abdominal: Soft. There is no tenderness.  Musculoskeletal: Normal range of motion.  Patient is tender to palpation her back diffusely, no focal C-spine T-spine or L-spine tenderness. She has full active range of motion of her neck, pain free.  Lymphadenopathy:    She has no cervical adenopathy.  Neurological: She is alert and oriented to person, place, and time. She has normal strength. No cranial nerve deficit or sensory deficit. She displays a negative Romberg sign. Coordination normal. GCS eye subscore is 4. GCS verbal subscore is 5. GCS motor subscore is 6.  Skin: Skin is warm and dry.  Psychiatric: She has a normal mood and affect. Her behavior is normal. Judgment and thought content normal.  Nursing note and vitals reviewed.   ED Course  Procedures (including critical care time) Labs Review Labs Reviewed  CBC WITH DIFFERENTIAL/PLATELET - Abnormal; Notable for the following:    WBC 16.2 (*)    Neutrophils Relative % 90 (*)    Neutro Abs 14.6 (*)    Lymphocytes Relative 7 (*)    All other components within normal limits  URINALYSIS, ROUTINE W REFLEX MICROSCOPIC - Abnormal; Notable for the following:    Color, Urine AMBER (*)    Specific Gravity, Urine 1.031 (*)    pH 8.5 (*)    Protein, ur 30 (*)    All other components  within normal limits  URINE MICROSCOPIC-ADD ON - Abnormal; Notable for the following:    Squamous Epithelial / LPF MANY (*)    All other components within normal limits  I-STAT CHEM 8, ED - Abnormal; Notable for the following:    Chloride 100 (*)    Glucose, Bld 104 (*)    Hemoglobin 15.6 (*)    All other components within normal limits  PREGNANCY, URINE  POC URINE PREG, ED  POC URINE PREG, ED    Imaging Review Dg Chest 2 View  09/28/2014   CLINICAL DATA:  Productive cough and fever.  EXAM: CHEST  2 VIEW  COMPARISON:  09/17/2014  FINDINGS: There is right middle lobe and lingular airspace disease most concerning for pneumonia. There is no pleural effusion or pneumothorax. The heart and mediastinal contours are unremarkable.  The osseous structures are unremarkable.  IMPRESSION: Right middle lobe and lingular pneumonia. There has been overall progression of disease compared with 09/17/2014.   Electronically Signed   By: Elige Ko   On: 09/28/2014 19:51     EKG Interpretation None      MDM   Final diagnoses:  Community acquired pneumonia    Labs: Point of care urine pregnant, urinalysis, i-STAT Chem-8, CBC, d-dimer  Imaging: DG chest 2 view shows right middle lobe and lingular pneumonia with overall progression of disease compared to 09/17/2014  Consults: None  Therapeutics: Dilaudid, Levaquin  Assessment: Pneumonia  Plan: Patient presents with pneumonia today. She received azithromycin therapy approximately 2 weeks ago with symptomatic improvement, still baseline cough that started to worsen over the last 2 days with the addition of back pain. Considered due to patient's tachycardia, and pleuritic type back pain. D-dimer today was within normal range, this is unlikely a pulmonary embolism causing this fluid in her lungs. Patient was nontoxic-appearing, with stable vital signs other than the intermittent tachycardia. She showed good symptom resolution with pain medication, she  remained afebrile throughout her stay. She was given a dose of Levaquin before discharge, 5 day course, strict return precautions the event new worsening signs or symptoms presented, and encouraged to follow up with her primary care physician in 3 days for reevaluation of symptoms. Patient and her significant other both verbalized her understanding and agreement to follow-up evaluation. No further questions or concerns at the time of discharge.      Eyvonne Mechanic, PA-C 09/28/14 2229  Arby Barrette, MD 10/03/14 1435

## 2014-09-28 NOTE — Discharge Instructions (Signed)
Please monitor for worsening signs or symptoms, return immediately if any present. I'll put your primary care provider in 3 days if symptoms continue to persist. Please read attached information.

## 2014-09-28 NOTE — ED Notes (Signed)
Pt st's she was seen here and dx with pneumonia 2 weeks ago.  St's she has finished her antibiotics but is not feeling any better,  St's now is has nausea and vomiting, unable to keep anything down

## 2017-01-16 ENCOUNTER — Ambulatory Visit: Payer: Medicaid Other | Admitting: Obstetrics and Gynecology

## 2017-02-05 ENCOUNTER — Ambulatory Visit: Payer: Medicaid Other | Admitting: Obstetrics & Gynecology

## 2017-02-09 ENCOUNTER — Ambulatory Visit (INDEPENDENT_AMBULATORY_CARE_PROVIDER_SITE_OTHER): Payer: Medicaid Other | Admitting: Obstetrics & Gynecology

## 2017-02-09 ENCOUNTER — Other Ambulatory Visit (HOSPITAL_COMMUNITY)
Admission: RE | Admit: 2017-02-09 | Discharge: 2017-02-09 | Disposition: A | Discharge status: Home / Self Care | Payer: Medicaid Other | Source: Ambulatory Visit | Attending: Obstetrics & Gynecology | Admitting: Obstetrics & Gynecology

## 2017-02-09 ENCOUNTER — Encounter: Payer: Self-pay | Admitting: Obstetrics & Gynecology

## 2017-02-09 VITALS — BP 117/69 | HR 75 | Ht 66.0 in | Wt 139.0 lb

## 2017-02-09 DIAGNOSIS — Z309 Encounter for contraceptive management, unspecified: Secondary | ICD-10-CM

## 2017-02-09 DIAGNOSIS — Z30017 Encounter for initial prescription of implantable subdermal contraceptive: Secondary | ICD-10-CM

## 2017-02-09 DIAGNOSIS — Z23 Encounter for immunization: Secondary | ICD-10-CM | POA: Diagnosis not present

## 2017-02-09 DIAGNOSIS — Z0000 Encounter for general adult medical examination without abnormal findings: Secondary | ICD-10-CM

## 2017-02-09 LAB — POCT PREGNANCY, URINE: Preg Test, Ur: NEGATIVE

## 2017-02-09 MED ORDER — ETONOGESTREL 68 MG ~~LOC~~ IMPL
68.0000 mg | DRUG_IMPLANT | Freq: Once | SUBCUTANEOUS | Status: AC
  Administered 2017-02-09: 68 mg via SUBCUTANEOUS

## 2017-02-09 NOTE — Progress Notes (Signed)
   Subjective:    Patient ID: Katherine Conrad, female    DOB: [birth year], 26 y.o.   MRN: [id number]  HPI  26 yo WP2 (3 and 38 yo kids) here to have Nexplanon inserted. She had a Nexplanon for 3 years in the past and liked it. She has been on OCPs recently but prefers Nexplanon. She is aware that irregular periods and/or no periods are known side effects of Nexplanon.  Review of Systems     Objective:   Physical Exam Well nourished, well hydrated white female, no apparent distress Breathing, conversing, and ambulating normally Consent was signed and time out was done. Her left arm was prepped with betadine after establishing the position of the Nexplanon. The area was infiltrated with 2 cc of 1% lidocaine. A small incision was made and the intact rod was easily removed and noted to be intact.  A steristrip was placed and her arm was noted to be hemostatic. It was bandaged.  She tolerated the procedure well.      Assessment & Plan:  Prevenative care- Pap today, flu vaccine today She will come next week for Gardasil when we have it in stock Contraception- Nexplanon Back up method for 2 weeks

## 2017-02-10 LAB — CYTOLOGY - PAP: DIAGNOSIS: NEGATIVE

## 2020-01-02 ENCOUNTER — Ambulatory Visit: Payer: Medicaid Other | Admitting: Medical

## 2020-01-11 ENCOUNTER — Ambulatory Visit: Payer: Medicaid Other | Admitting: Family Medicine

## 2020-02-22 ENCOUNTER — Other Ambulatory Visit: Payer: Self-pay

## 2020-02-22 ENCOUNTER — Ambulatory Visit (INDEPENDENT_AMBULATORY_CARE_PROVIDER_SITE_OTHER): Payer: BC Managed Care – PPO | Admitting: Family Medicine

## 2020-02-22 ENCOUNTER — Encounter: Payer: Self-pay | Admitting: Family Medicine

## 2020-02-22 VITALS — BP 120/88 | HR 78 | Wt 120.3 lb

## 2020-02-22 DIAGNOSIS — Z3049 Encounter for surveillance of other contraceptives: Secondary | ICD-10-CM | POA: Diagnosis not present

## 2020-02-22 DIAGNOSIS — Z975 Presence of (intrauterine) contraceptive device: Secondary | ICD-10-CM | POA: Insufficient documentation

## 2020-02-22 DIAGNOSIS — Z3046 Encounter for surveillance of implantable subdermal contraceptive: Secondary | ICD-10-CM | POA: Diagnosis not present

## 2020-02-22 DIAGNOSIS — Z30017 Encounter for initial prescription of implantable subdermal contraceptive: Secondary | ICD-10-CM | POA: Diagnosis not present

## 2020-02-22 MED ORDER — ETONOGESTREL 68 MG ~~LOC~~ IMPL
68.0000 mg | DRUG_IMPLANT | Freq: Once | SUBCUTANEOUS | Status: AC
  Administered 2020-02-22: 68 mg via SUBCUTANEOUS

## 2020-02-22 NOTE — Progress Notes (Signed)
GYNECOLOGY CLINIC PROCEDURE NOTE  Ms. Katherine Conrad is a 29 y.o. G2P1001 here for Nexplanon removal and Nexplanon insertion. No GYN concerns.  Last pap smear was on 02/2017 and was normal.  No other gynecologic concerns. Patient told to schedule annual exam for repeat pap smear.  Nexplanon Removal and Insertion  Patient was given informed consent for removal of her Implanon and insertion of Nexplanon.  Patient does understand that irregular bleeding is a very common side effect of this medication. She was advised to have backup contraception for one week after replacement of the implant. Pregnancy test in clinic not performed given re-insertion.  Appropriate time out taken. Nexplanon site identified. Area prepped in usual sterile fashon. 58ml of 1% lidocaine was used to anesthetize the area of the prior implant. A small stab incision was made right beside the implant on the distal portion. The Nexplanon rod was grasped using a hemostat and removed without difficulty. There was minimal blood loss. Nexplanon removed from packaging, Device confirmed in needle, then inserted full length of needle and withdrawn per handbook instructions. Nexplanon was able to palpated in the patient's arm; patient palpated the insert herself.  There was minimal blood loss. Steri strips applied. Patient insertion site covered with guaze and a pressure bandage to reduce any bruising. The patient tolerated the procedure well and was given post procedure instructions.   Alric Seton, MD 02/22/2020 11:23 AM

## 2020-02-22 NOTE — Addendum Note (Signed)
Addended by: Faythe Casa on: 02/22/2020 12:00 PM   Modules accepted: Orders

## 2020-02-22 NOTE — Patient Instructions (Signed)
Nexplanon Instructions After Insertion  Keep bandage clean and dry for 24 hours  May use ice/Tylenol/Ibuprofen for soreness or pain  If you develop fever, drainage or increased warmth from incision site-contact office immediately   

## 2020-03-01 ENCOUNTER — Encounter: Payer: Self-pay | Admitting: *Deleted

## 2024-01-13 ENCOUNTER — Encounter: Payer: Self-pay | Admitting: Obstetrics & Gynecology
